# Patient Record
Sex: Female | Born: 1995 | Race: White | Hispanic: No | Marital: Married | Smoking: Current some day smoker
Health system: Southern US, Community
[De-identification: ages and names within clinical notes are randomized; demographics above are authoritative.]

## PROBLEM LIST (undated history)

## (undated) DIAGNOSIS — F32A Depression, unspecified: Secondary | ICD-10-CM

## (undated) DIAGNOSIS — F419 Anxiety disorder, unspecified: Secondary | ICD-10-CM

## (undated) HISTORY — PX: NO PAST SURGERIES: SHX2092

---
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
## 2016-07-17 ENCOUNTER — Other Ambulatory Visit: Payer: Self-pay | Admitting: Family Medicine

## 2016-07-17 ENCOUNTER — Ambulatory Visit
Admission: RE | Admit: 2016-07-17 | Discharge: 2016-07-17 | Disposition: A | Discharge status: Home / Self Care | Payer: 59 | Source: Ambulatory Visit | Attending: Family Medicine | Admitting: Family Medicine

## 2016-07-17 DIAGNOSIS — R109 Unspecified abdominal pain: Secondary | ICD-10-CM

## 2017-08-28 ENCOUNTER — Other Ambulatory Visit: Payer: Self-pay | Admitting: Occupational Medicine

## 2017-08-28 ENCOUNTER — Ambulatory Visit
Admission: RE | Admit: 2017-08-28 | Discharge: 2017-08-28 | Disposition: A | Discharge status: Home / Self Care | Payer: No Typology Code available for payment source | Source: Ambulatory Visit | Attending: Occupational Medicine | Admitting: Occupational Medicine

## 2017-08-28 DIAGNOSIS — Z021 Encounter for pre-employment examination: Secondary | ICD-10-CM

## 2020-02-23 ENCOUNTER — Telehealth: Payer: Self-pay

## 2020-02-23 NOTE — Telephone Encounter (Signed)
Copied from CRM [id number]. Topic: Appointment Scheduling - Scheduling Inquiry for Clinic >> Feb 23, 2020  9:56 AM Crist Infante wrote: Reason for CRM: pt calling to see if she can be worked in sooner for new pt appt.  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  Her previous dr doesn't accept her insurance anymore, and she would have to pay over $200 for an office visit with him. Could Ricki Rodriguez work her in sooner?

## 2020-02-23 NOTE — Telephone Encounter (Signed)
Pt has appt for 03/11/20.  Hoping to get in sooner

## 2020-02-25 NOTE — Telephone Encounter (Signed)
Per Ricki Rodriguez, there are no available openings until her scheduled appt.

## 2020-03-11 ENCOUNTER — Encounter: Payer: Self-pay | Admitting: Physician Assistant

## 2020-03-11 ENCOUNTER — Ambulatory Visit (INDEPENDENT_AMBULATORY_CARE_PROVIDER_SITE_OTHER): Admitting: Physician Assistant

## 2020-03-11 ENCOUNTER — Other Ambulatory Visit: Payer: Self-pay

## 2020-03-11 VITALS — BP 104/69 | HR 70 | Temp 98.2°F | Ht 61.0 in | Wt 137.0 lb

## 2020-03-11 DIAGNOSIS — F329 Major depressive disorder, single episode, unspecified: Secondary | ICD-10-CM

## 2020-03-11 DIAGNOSIS — Z13220 Encounter for screening for lipoid disorders: Secondary | ICD-10-CM

## 2020-03-11 DIAGNOSIS — Z309 Encounter for contraceptive management, unspecified: Secondary | ICD-10-CM | POA: Diagnosis not present

## 2020-03-11 DIAGNOSIS — F32A Depression, unspecified: Secondary | ICD-10-CM

## 2020-03-11 LAB — POCT URINE PREGNANCY: Preg Test, Ur: NEGATIVE

## 2020-03-11 MED ORDER — MEDROXYPROGESTERONE ACETATE 150 MG/ML IM SUSP
150.0000 mg | Freq: Once | INTRAMUSCULAR | Status: AC
  Administered 2020-03-11: 150 mg via INTRAMUSCULAR

## 2020-03-11 MED ORDER — BUSPIRONE HCL 7.5 MG PO TABS: 7.5000 mg | ORAL_TABLET | Freq: Two times a day (BID) | ORAL | 1 refills | Status: DC

## 2020-03-11 NOTE — Progress Notes (Signed)
New patient visit   Patient: Linda Bright   DOB: 1995/09/22   24 y.o. Female  MRN: [id number] Visit Date: 03/11/2020  Today's healthcare provider: Trey Sailors, PA-C   Chief Complaint  Patient presents with  . New Patient (Initial Visit)   Subjective    Linda Bright is a 24 y.o. female who presents today as a new patient to establish care.  HPI  Patient comes in today wanting to start back on Zoloft 25mg  daily. Reports she had sexual side effects however from zoloft and would like to consider switching. She was previously a patient at . She is also wanting to continue Depo shots. Her last one was in 11/2019.   Patient works at the 12/2019. 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 She has recently gotten married to her wife since 11/2019.     History reviewed. No pertinent past medical history. History reviewed. No pertinent surgical history. Family Status  Relation Name Status  . Mother  Alive  . Father  Alive  . Brother  Alive  . PGM  Alive   Family History  Problem Relation Age of Onset  . Graves' disease Mother   . Asthma Brother   . Diabetes Paternal Grandmother    Social History   Socioeconomic History  . Marital status: Married    Spouse name: Not on file  . Number of children: Not on file  . Years of education: Not on file  . Highest education level: Not on file  Occupational History  . Not on file  Tobacco Use  . Smoking status: Never Smoker  . Smokeless tobacco: Never Used  Substance and Sexual Activity  . Alcohol use: Yes    Alcohol/week: 2.0 standard drinks    Types: 2 Cans of beer per week  . Drug use: Never  . Sexual activity: Yes  Other Topics Concern  . Not on file  Social History Narrative  . Not on file   Social Determinants of Health   Financial Resource Strain:   . Difficulty of Paying Living Expenses:   Food Insecurity:   . Worried About 12/2019 in the Last Year:   . Patent examiner in the Last Year:   Transportation Needs:   . Barista (Medical):   Freight forwarder Lack of Transportation (Non-Medical):   Physical Activity:   . Days of Exercise per Week:   . Minutes of Exercise per Session:   Stress:   . Feeling of Stress :   Social Connections:   . Frequency of Communication with Friends and Family:   . Frequency of Social Gatherings with Friends and Family:   . Attends Religious Services:   . Active Member of Clubs or Organizations:   . Attends Marland Kitchen Meetings:   Banker Marital Status:    No outpatient medications prior to visit.   No facility-administered medications prior to visit.   Allergies  Allergen Reactions  . Advil [Ibuprofen]     Rash     There is no immunization history on file for this patient.  Health Maintenance  Topic Date Due  . Hepatitis C Screening  Never done  . HIV Screening  Never done  . TETANUS/TDAP  Never done  . PAP-Cervical Cytology Screening  Never done  . PAP SMEAR-Modifier  Never done  . INFLUENZA VACCINE  03/27/2020    Patient Care Team: 05/27/2020,  PA-C as PCP - General (Physician Assistant)  Review of Systems  Constitutional: Negative.   HENT: Negative.   Eyes: Negative.   Respiratory: Negative.   Cardiovascular: Negative.   Gastrointestinal: Negative.   Endocrine: Negative.   Genitourinary: Negative.   Musculoskeletal: Negative.   Skin: Negative.   Allergic/Immunologic: Negative.   Neurological: Negative.   Hematological: Negative.   Psychiatric/Behavioral: Negative.       Objective    BP 104/69   Pulse 70   Temp 98.2 F (36.8 C)   Ht 5\' 1"  (1.549 m)   Wt 137 lb (62.1 kg)   BMI 25.89 kg/m  Physical Exam Constitutional:      Appearance: Normal appearance.  HENT:     Right Ear: Tympanic membrane normal.     Left Ear: Tympanic membrane normal.  Cardiovascular:     Rate and Rhythm: Normal rate and regular rhythm.     Pulses:  Normal pulses.     Heart sounds: Normal heart sounds.  Pulmonary:     Effort: Pulmonary effort is normal.     Breath sounds: Normal breath sounds.  Skin:    General: Skin is warm and dry.  Neurological:     Mental Status: She is alert and oriented to person, place, and time. Mental status is at baseline.  Psychiatric:        Mood and Affect: Mood normal.        Behavior: Behavior normal.     Depression Screen PHQ 2/9 Scores 03/11/2020  PHQ - 2 Score 2  PHQ- 9 Score 8   Results for orders placed or performed in visit on 03/11/20  CBC with Differential/Platelet  Result Value Ref Range   WBC 5.8 3.4 - 10.8 x10E3/uL   RBC 4.59 3.77 - 5.28 x10E6/uL   Hemoglobin 14.9 11.1 - 15.9 g/dL   Hematocrit 03/13/20 02.5 - 46.6 %   MCV 93 79 - 97 fL   MCH 32.5 26.6 - 33.0 pg   MCHC 35.1 31 - 35 g/dL   RDW 85.2 77.8 - 24.2 %   Platelets 325 150 - 450 x10E3/uL   Neutrophils 45 Not Estab. %   Lymphs 40 Not Estab. %   Monocytes 9 Not Estab. %   Eos 5 Not Estab. %   Basos 1 Not Estab. %   Neutrophils Absolute 2.6 1 - 7 x10E3/uL   Lymphocytes Absolute 2.4 0 - 3 x10E3/uL   Monocytes Absolute 0.5 0 - 0 x10E3/uL   EOS (ABSOLUTE) 0.3 0.0 - 0.4 x10E3/uL   Basophils Absolute 0.1 0 - 0 x10E3/uL   Immature Granulocytes 0 Not Estab. %   Immature Grans (Abs) 0.0 0.0 - 0.1 x10E3/uL  Comprehensive metabolic panel  Result Value Ref Range   Glucose 107 (H) 65 - 99 mg/dL   BUN 11 6 - 20 mg/dL   Creatinine, Ser 35.3 0.57 - 1.00 mg/dL   GFR calc non Af Amer 109 >59 mL/min/1.73   GFR calc Af Amer 125 >59 mL/min/1.73   BUN/Creatinine Ratio 14 9 - 23   Sodium 140 134 - 144 mmol/L   Potassium 4.4 3.5 - 5.2 mmol/L   Chloride 103 96 - 106 mmol/L   CO2 23 20 - 29 mmol/L   Calcium 9.2 8.7 - 10.2 mg/dL   Total Protein 6.9 6.0 - 8.5 g/dL   Albumin 4.5 3.9 - 5.0 g/dL   Globulin, Total 2.4 1.5 - 4.5 g/dL   Albumin/Globulin Ratio 1.9 1.2 - 2.2   Bilirubin  Total 0.4 0.0 - 1.2 mg/dL   Alkaline Phosphatase 77 48 -  121 IU/L   AST 25 0 - 40 IU/L   ALT 23 0 - 32 IU/L  Lipid panel  Result Value Ref Range   Cholesterol, Total 135 100 - 199 mg/dL   Triglycerides 64 0 - 149 mg/dL   HDL 29 (L) >31 mg/dL   VLDL Cholesterol Cal 13 5 - 40 mg/dL   LDL Chol Calc (NIH) 93 0 - 99 mg/dL   Chol/HDL Ratio 4.7 (H) 0.0 - 4.4 ratio  TSH  Result Value Ref Range   TSH 1.210 0.450 - 4.500 uIU/mL  POCT urine pregnancy  Result Value Ref Range   Preg Test, Ur Negative Negative    Assessment & Plan     1. Depression, unspecified depression type  - busPIRone (BUSPAR) 7.5 MG tablet; Take 1 tablet (7.5 mg total) by mouth 2 (two) times daily.  Dispense: 180 tablet; Refill: 1 - CBC with Differential/Platelet - Comprehensive metabolic panel - TSH  2. Encounter for contraceptive management, unspecified type Update Depo today. Patient due on or between Oct 1st- Oct 15th for next injection.  - POCT urine pregnancy - medroxyPROGESTERone (DEPO-PROVERA) injection 150 mg  3. Lipid screening  - Lipid panel   Return in about 6 weeks (around 04/22/2020) for depression.     ITrey Sailors, PA-C, have reviewed all documentation for this visit. The documentation on 03/15/20 for the exam, diagnosis, procedures, and orders are all accurate and complete.    Maryella Shivers  [facility] [phone] (phone) [phone] (fax)  [facility]

## 2020-03-12 LAB — COMPREHENSIVE METABOLIC PANEL
ALT: 23 IU/L (ref 0–32)
AST: 25 IU/L (ref 0–40)
Albumin/Globulin Ratio: 1.9 (ref 1.2–2.2)
Albumin: 4.5 g/dL (ref 3.9–5.0)
Alkaline Phosphatase: 77 IU/L (ref 48–121)
BUN/Creatinine Ratio: 14 (ref 9–23)
BUN: 11 mg/dL (ref 6–20)
Bilirubin Total: 0.4 mg/dL (ref 0.0–1.2)
CO2: 23 mmol/L (ref 20–29)
Calcium: 9.2 mg/dL (ref 8.7–10.2)
Chloride: 103 mmol/L (ref 96–106)
Creatinine, Ser: 0.76 mg/dL (ref 0.57–1.00)
GFR calc Af Amer: 125 mL/min/{1.73_m2} (ref >59)
GFR calc non Af Amer: 109 mL/min/{1.73_m2} (ref >59)
Globulin, Total: 2.4 g/dL (ref 1.5–4.5)
Glucose: 107 mg/dL — ABNORMAL HIGH (ref 65–99)
Potassium: 4.4 mmol/L (ref 3.5–5.2)
Sodium: 140 mmol/L (ref 134–144)
Total Protein: 6.9 g/dL (ref 6.0–8.5)

## 2020-03-12 LAB — CBC WITH DIFFERENTIAL/PLATELET
Basophils Absolute: 0.1 10*3/uL (ref 0.0–0.2)
Basos: 1 %
EOS (ABSOLUTE): 0.3 10*3/uL (ref 0.0–0.4)
Eos: 5 %
Hematocrit: 42.5 % (ref 34.0–46.6)
Hemoglobin: 14.9 g/dL (ref 11.1–15.9)
Immature Grans (Abs): 0 10*3/uL (ref 0.0–0.1)
Immature Granulocytes: 0 %
Lymphocytes Absolute: 2.4 10*3/uL (ref 0.7–3.1)
Lymphs: 40 %
MCH: 32.5 pg (ref 26.6–33.0)
MCHC: 35.1 g/dL (ref 31.5–35.7)
MCV: 93 fL (ref 79–97)
Monocytes Absolute: 0.5 10*3/uL (ref 0.1–0.9)
Monocytes: 9 %
Neutrophils Absolute: 2.6 10*3/uL (ref 1.4–7.0)
Neutrophils: 45 %
Platelets: 325 10*3/uL (ref 150–450)
RBC: 4.59 x10E6/uL (ref 3.77–5.28)
RDW: 12.1 % (ref 11.7–15.4)
WBC: 5.8 10*3/uL (ref 3.4–10.8)

## 2020-03-12 LAB — LIPID PANEL
Chol/HDL Ratio: 4.7 ratio — ABNORMAL HIGH (ref 0.0–4.4)
Cholesterol, Total: 135 mg/dL (ref 100–199)
HDL: 29 mg/dL — ABNORMAL LOW (ref >39)
LDL Chol Calc (NIH): 93 mg/dL (ref 0–99)
Triglycerides: 64 mg/dL (ref 0–149)
VLDL Cholesterol Cal: 13 mg/dL (ref 5–40)

## 2020-03-12 LAB — TSH: TSH: 1.21 u[IU]/mL (ref 0.450–4.500)

## 2020-03-15 ENCOUNTER — Encounter: Payer: Self-pay | Admitting: Physician Assistant

## 2020-03-15 NOTE — Progress Notes (Signed)
A1C was added to labs and faxed to [facility].

## 2020-03-16 ENCOUNTER — Encounter (INDEPENDENT_AMBULATORY_CARE_PROVIDER_SITE_OTHER): Admitting: Physician Assistant

## 2020-03-16 DIAGNOSIS — F419 Anxiety disorder, unspecified: Secondary | ICD-10-CM

## 2020-03-16 DIAGNOSIS — F32A Depression, unspecified: Secondary | ICD-10-CM

## 2020-03-16 DIAGNOSIS — F329 Major depressive disorder, single episode, unspecified: Secondary | ICD-10-CM | POA: Diagnosis not present

## 2020-03-16 LAB — SPECIMEN STATUS REPORT

## 2020-03-16 LAB — HEMOGLOBIN A1C
Est. average glucose Bld gHb Est-mCnc: 97 mg/dL
Hgb A1c MFr Bld: 5 % (ref 4.8–5.6)

## 2020-03-18 MED ORDER — SERTRALINE HCL 25 MG PO TABS: 25.0000 mg | ORAL_TABLET | Freq: Every day | ORAL | 1 refills | Status: DC

## 2020-03-18 NOTE — Telephone Encounter (Signed)
    MyChart Video Visit    Virtual Visit via Video Note   This visit type was conducted due to national recommendations for restrictions regarding the COVID-19 Pandemic (e.g. social distancing) in an effort to limit this patient's exposure and mitigate transmission in our community. This patient is at least at moderate risk for complications without adequate follow up. This format is felt to be most appropriate for this patient at this time. Physical exam was limited by quality of the video and audio technology used for the visit.   Patient location: Home Provider location: Office   I discussed the limitations of evaluation and management by telemedicine and the availability of in person appointments. The patient expressed understanding and agreed to proceed.   Patient: Linda Bright   DOB: November 01, 1995   24 y.o. Female  MRN: [id number] Visit Date: 03/18/2020 Today's healthcare provider: Trey Sailors, PA-C   CC: Depression    HPI  Patient presenting today for follow up of depression. Was on zoloft 25 mg daily previously but after insurance changes, she ran out of the medication and thus weaned off. She had low sex drive from this and so wanted to switch medications. She was placed on Buspar 7.5 mg BID but this was causing intolerable drowsiness. Looking to switch.    Medications: Outpatient Medications Prior to Visit  Medication Sig  . busPIRone (BUSPAR) 7.5 MG tablet Take 1 tablet (7.5 mg total) by mouth 2 (two) times daily.   No facility-administered medications prior to visit.    ROS   Negative except for HPI         Physical Exam  Resp: No respiratory distress. Breathing unlabored. Behavioral: Mood and behavior normal.       1. Anxiety and depression  Can decrease buspar or switch back to zoloft. Patient would like to switch back to zoloft.  - sertraline (ZOLOFT) 25 MG tablet; Take 1 tablet (25 mg total) by mouth daily.  Dispense: 90 tablet; Refill:  1    No follow-ups on file.  @ASSESSMENTEND @  I discussed the assessment and treatment plan with the patient. The patient was provided an opportunity to ask questions and all were answered. The patient agreed with the plan and demonstrated an understanding of the instructions.   The patient was advised to call back or seek an in-person evaluation if the symptoms worsen or if the condition fails to improve as anticipated.  I , PA-C, have reviewed all documentation for this visit. The documentation on 03/18/20 for the exam, diagnosis, procedures, and orders are all accurate and complete.   03/20/20, PA-C [facility]

## 2020-06-01 ENCOUNTER — Telehealth: Payer: Self-pay | Admitting: *Deleted

## 2020-06-01 ENCOUNTER — Encounter: Payer: Self-pay | Admitting: Physician Assistant

## 2020-06-01 ENCOUNTER — Ambulatory Visit (INDEPENDENT_AMBULATORY_CARE_PROVIDER_SITE_OTHER): Admitting: Physician Assistant

## 2020-06-01 ENCOUNTER — Other Ambulatory Visit: Payer: Self-pay

## 2020-06-01 VITALS — BP 95/68 | HR 66 | Temp 98.6°F | Resp 16 | Ht 62.0 in | Wt 134.0 lb

## 2020-06-01 DIAGNOSIS — F32A Depression, unspecified: Secondary | ICD-10-CM

## 2020-06-01 DIAGNOSIS — Z309 Encounter for contraceptive management, unspecified: Secondary | ICD-10-CM

## 2020-06-01 MED ORDER — BUPROPION HCL ER (SR) 150 MG PO TB12: 150.0000 mg | ORAL_TABLET | Freq: Two times a day (BID) | ORAL | 1 refills | Status: DC

## 2020-06-01 MED ORDER — MEDROXYPROGESTERONE ACETATE 150 MG/ML IM SUSP
150.0000 mg | Freq: Once | INTRAMUSCULAR | Status: AC
  Administered 2020-06-01: 150 mg via INTRAMUSCULAR

## 2020-06-01 NOTE — Chronic Care Management (AMB) (Signed)
  Care Management   Note  06/01/2020 Name: Linda Bright MRN: [id number] DOB: 01-11-1996  Linda Bright is a 24 y.o. year old female who is a primary care patient of Trey Sailors, [place]. I reached out to [name] by phone today in response to a referral sent by [name] health plan.    [name] was given information about care management services today including:  1. Care management services include personalized support from designated clinical staff supervised by her physician, including individualized plan of care and coordination with other care providers 2. 24/7 contact phone numbers for assistance for urgent and routine care needs. 3. The patient may stop care management services at any time by phone call to the office staff.  Patient agreed to services and verbal consent obtained.   Follow up plan: Telephone appointment with care management team member scheduled for: 06/07/2020 [facility] Guide, Embedded Care Coordination [facility] Management

## 2020-06-01 NOTE — Progress Notes (Signed)
Established patient visit   Patient: Linda Bright   DOB: 1995-11-17   24 y.o. Female  MRN: [id number] Visit Date: 06/01/2020  Today's healthcare provider: Trey Sailors, PA-C   Chief Complaint  Patient presents with  . Depression  . Contraception   Subjective    HPI  Depression, Follow-up  She  was last seen for this 3 months ago. Changes made at last visit include started on Zoloft 25mg . Patient was previously on zoloft which had worked well for her but causes sexual side effects. Was then switched to buspar which she reported caused drowsiness and so she was switched back to zoloft at her request.    She reports good compliance with treatment. She is having side effects.   She reports good tolerance of treatment. Current symptoms include: difficulty concentrating, fatigue, insomnia and weight loss. Reports sexual side effects. Hasn't been fatigued, hasn't been working out as much. Feels immediately sick after eating. Sex drive decreased. Reports work is about as stressful as usual.   She feels she is Worse since last visit.  Depression screen PHQ 2/9 03/11/2020  Decreased Interest 1  Down, Depressed, Hopeless 1  PHQ - 2 Score 2  Altered sleeping 2  Tired, decreased energy 2  Change in appetite 1  Feeling bad or failure about yourself  0  Trouble concentrating 1  Moving slowly or fidgety/restless 0  Suicidal thoughts 0  PHQ-9 Score 8  Difficult doing work/chores Somewhat difficult    Wt Readings from Last 3 Encounters:  06/01/20 134 lb (60.8 kg)  03/11/20 137 lb (62.1 kg)        Medications: Outpatient Medications Prior to Visit  Medication Sig  . sertraline (ZOLOFT) 25 MG tablet Take 1 tablet (25 mg total) by mouth daily.   No facility-administered medications prior to visit.    Review of Systems  Constitutional: Positive for activity change, appetite change and fatigue.  Gastrointestinal: Positive for nausea. Negative for abdominal pain,  constipation, diarrhea and vomiting.      Objective    BP 95/68   Pulse 66   Temp 98.6 F (37 C)   Resp 16   Ht 5\' 2"  (1.575 m)   Wt 134 lb (60.8 kg)   BMI 24.51 kg/m    Physical Exam Constitutional:      Appearance: Normal appearance.  Cardiovascular:     Rate and Rhythm: Normal rate and regular rhythm.  Pulmonary:     Effort: Pulmonary effort is normal. No respiratory distress.  Skin:    General: Skin is warm and dry.  Neurological:     Mental Status: She is alert and oriented to person, place, and time. Mental status is at baseline.  Psychiatric:        Mood and Affect: Affect normal. Mood is depressed.        Behavior: Behavior normal.       No results found for any visits on 06/01/20.  Assessment & Plan    1. Depression, unspecified depression type  Change to wellbutrin due to sexual side effects. Discontinue zoloft. Refer to counseling. Will have her follow up in 3 months for CPE w/ PAP and depression follow up.  - buPROPion (WELLBUTRIN SR) 150 MG 12 hr tablet; Take 1 tablet (150 mg total) by mouth 2 (two) times daily.  Dispense: 180 tablet; Refill: 1 - Ambulatory referral to Chronic Care Management Services  2. Encounter for contraceptive management, unspecified type  - medroxyPROGESTERone (DEPO-PROVERA) injection 150  mg   Return in about 3 months (around 09/01/2020) for CPE and f/u .      ITrey Sailors, PA-C, have reviewed all documentation for this visit. The documentation on 06/01/20 for the exam, diagnosis, procedures, and orders are all accurate and complete.  The entirety of the information documented in the History of Present Illness, Review of Systems and Physical Exam were personally obtained by me. Portions of this information were initially documented by Anson [place], CMA and reviewed by me for thoroughness and accuracy.     Maryella Shivers  [facility] [phone] (phone) [phone] (fax)  [facility]

## 2020-06-07 ENCOUNTER — Ambulatory Visit: Admitting: *Deleted

## 2020-06-07 DIAGNOSIS — F419 Anxiety disorder, unspecified: Secondary | ICD-10-CM

## 2020-06-07 DIAGNOSIS — F32A Depression, unspecified: Secondary | ICD-10-CM

## 2020-06-08 NOTE — Chronic Care Management (AMB) (Signed)
   Care Management    Clinical Social Work Follow Up Note  06/08/2020 Name: Linda Bright MRN: [id number] DOB: Nov 26, 1995  Linda Bright is a 24 y.o. year old female who is a primary care patient of Trey Sailors, [place]. The CCM team was consulted for assistance with Mental Health Counseling and Resources.   Review of patient status, including review of consultants reports, other relevant assessments, and collaboration with appropriate care team members and the patient's provider was performed as part of comprehensive patient evaluation and provision of chronic care management services.    SDOH (Social Determinants of Health) assessments performed: No    Outpatient Encounter Medications as of 06/07/2020  Medication Sig  . buPROPion (WELLBUTRIN SR) 150 MG 12 hr tablet Take 1 tablet (150 mg total) by mouth 2 (two) times daily.  . sertraline (ZOLOFT) 25 MG tablet Take 1 tablet (25 mg total) by mouth daily.   No facility-administered encounter medications on file as of 06/07/2020.     Goals Addressed              This Visit's Progress   .  "I would like to start therapy again" (pt-stated)        CARE PLAN ENTRY (see longitudinal plan of care for additional care plan information)  Current Barriers:  Marland Kitchen Mental Health Concerns   Clinical Social Work Clinical Goal(s):  Marland Kitchen Over the next 90 days, patient will follow up with a local mental health program* as directed by SW  Interventions: . Inter-disciplinary care team collaboration (see longitudinal plan of care) . Patient interviewed and appropriate assessments performed . Patient discussed symptoms of depression and PTSD . Patient verbalized positive coping strategies used to address her depression and anxiety, including the start of a new medication prescribed that has been helpful . Emotional support and positive reinforcement provided . Patient discussed desire to begin therapy again, with prior history of mental  health follow up at the [facility]. . Patient discussed that she received EMDR treatment and found it beneficial . Provided patient with information about with information regarding the [facility] confirming that they are in network and they have a therapist who specializes in EMDR . Collaborated with the [facility]-referral completed for long term mental health follow up . Discussed plans with patient for ongoing care management follow up and provided patient with direct contact information for care management team  Patient Self Care Activities:  . Patient verbalizes understanding of plan to follow up with a mental health provider for ongoing/long term mental health counseling . Attends all scheduled provider appointments . Performs ADL's independently . Performs IADL's independently . Unaware of local providers that provide EMDR treatment  Initial goal documentation         Follow Up Plan: SW will follow up with patient by phone over the next 7-14 busuines days    Abra Lingenfelter, [place] Clinical Social Worker  [facility] [phone]

## 2020-06-08 NOTE — Patient Instructions (Signed)
Thank you allowing the Chronic Care Management Team to be a part of your care! It was a pleasure speaking with you today!  1. Please anticipate a phone call from the mental health program to schedule your initial intake appointment.  CCM (Chronic Care Management) Team   Juanell Fairly RN, BSN Nurse Care Coordinator  [phone]  [place], LCSW Clinical Social Worker [phone]  Goals Addressed              This Visit's Progress   .  "I would like to start therapy again" (pt-stated)        CARE PLAN ENTRY (see longitudinal plan of care for additional care plan information)  Current Barriers:  Marland Kitchen Mental Health Concerns   Clinical Social Work Clinical Goal(s):  Marland Kitchen Over the next 90 days, patient will follow up with a local mental health program* as directed by SW  Interventions: . Inter-disciplinary care team collaboration (see longitudinal plan of care) . Patient interviewed and appropriate assessments performed . Patient discussed symptoms of depression and PTSD . Patient verbalized positive coping strategies used to address her depression and anxiety, including the start of a new medication prescribed that has been helpful . Emotional support and positive reinforcement provided . Patient discussed desire to begin therapy again, with prior history of mental health follow up at the [facility]. . Patient discussed that she received EMDR treatment and found it beneficial . Provided patient with information about with information regarding the [facility] confirming that they are in network and they have a therapist who specializes in EMDR . Collaborated with the [facility]-referral completed for long term mental health follow up . Discussed plans with patient for ongoing care management follow up and provided patient with direct contact information for care management team  Patient Self Care Activities:  . Patient verbalizes  understanding of plan to follow up with a mental health provider for ongoing/long term mental health counseling . Attends all scheduled provider appointments . Performs ADL's independently . Performs IADL's independently . Unaware of local providers that provide EMDR treatment  Initial goal documentation         The patient verbalized understanding of instructions provided today and declined a print copy of patient instruction materials.   Telephone follow up appointment with care management team member scheduled for:  06/16/20

## 2020-06-16 ENCOUNTER — Ambulatory Visit: Payer: Self-pay | Admitting: *Deleted

## 2020-06-16 DIAGNOSIS — F419 Anxiety disorder, unspecified: Secondary | ICD-10-CM

## 2020-06-16 DIAGNOSIS — F32A Depression, unspecified: Secondary | ICD-10-CM

## 2020-06-16 NOTE — Chronic Care Management (AMB) (Signed)
   Care Management    Clinical Social Work Follow Up Note  06/16/2020 Name: Linda Bright MRN: [id number] DOB: 22-Jun-1996  Linda Bright is a 24 y.o. year old female who is a primary care patient of Trey Sailors, [place]. The CCM team was consulted for assistance with Mental Health Counseling and Resources.   Review of patient status, including review of consultants reports, other relevant assessments, and collaboration with appropriate care team members and the patient's provider was performed as part of comprehensive patient evaluation and provision of chronic care management services.    SDOH (Social Determinants of Health) assessments performed: No    Outpatient Encounter Medications as of 06/16/2020  Medication Sig  . buPROPion (WELLBUTRIN SR) 150 MG 12 hr tablet Take 1 tablet (150 mg total) by mouth 2 (two) times daily.  . sertraline (ZOLOFT) 25 MG tablet Take 1 tablet (25 mg total) by mouth daily.   No facility-administered encounter medications on file as of 06/16/2020.     Goals Addressed              This Visit's Progress   .  "I would like to start therapy again" (pt-stated)        CARE PLAN ENTRY (see longitudinal plan of care for additional care plan information)  Current Barriers:  Marland Kitchen Mental Health Concerns   Clinical Social Work Clinical Goal(s):  Marland Kitchen Over the next 90 days, patient will follow up with a local mental health program* as directed by SW  Interventions: . Follow up phone call to patient regarding mental health follow up . Patient confirmed initial appointment taking place today and reports that it seemed to go well . Patient verbalized plan to continue with [facility] for ongoing mental health treatment . Patient provided with positive reinforcement for follow through with mental health treatment . Patient encouraged to call this social worker with any additional questions or concerns regarding her  mental health or community resource needs. . Discussed plans with patient for ongoing care management follow up and provided patient with direct contact information for care management team  Patient Self Care Activities:  . Patient verbalizes understanding of plan to follow up with a mental health provider for ongoing/long term mental health counseling . Attends all scheduled provider appointments . Performs ADL's independently . Performs IADL's independently . Unaware of local providers that provide EMDR treatment  Initial goal documentation         Follow Up Plan: SW will follow up with patient by phone over the next 7-14 business days regarding the status of her mental health treatment    Verna Czech, LCSW Clinical Social Worker  [facility] [phone]

## 2020-06-16 NOTE — Patient Instructions (Signed)
Thank you allowing the Chronic Care Management Team to be a part of your care! It was a pleasure speaking with you today!  1. Please continue to follow up with your outpatient mental health counselor  CCM (Chronic Care Management) Team   Juanell Fairly RN, BSN Nurse Care Coordinator  [phone]  Wayland Baik [address], LCSW Clinical Social Worker [phone]  Goals Addressed              This Visit's Progress   .  "I would like to start therapy again" (pt-stated)        CARE PLAN ENTRY (see longitudinal plan of care for additional care plan information)  Current Barriers:  Marland Kitchen Mental Health Concerns   Clinical Social Work Clinical Goal(s):  Marland Kitchen Over the next 90 days, patient will follow up with a local mental health program* as directed by SW  Interventions: . Follow up phone call to patient regarding mental health follow up . Patient confirmed initial appointment taking place today and reports that it seemed to go well . Patient verbalized plan to continue with the [facility] for ongoing mental health treatment . Patient provided with positive reinforcement for follow through with mental health treatment . Patient encouraged to call this social worker with any additional questions or concerns regarding her mental health or community resource needs. . Discussed plans with patient for ongoing care management follow up and provided patient with direct contact information for care management team  Patient Self Care Activities:  . Patient verbalizes understanding of plan to follow up with a mental health provider for ongoing/long term mental health counseling . Attends all scheduled provider appointments . Performs ADL's independently . Performs IADL's independently . Unaware of local providers that provide EMDR treatment  Initial goal documentation         The patient verbalized understanding of instructions provided today and declined a print  copy of patient instruction materials.   Telephone follow up appointment with care management team member scheduled for:  06/30/20

## 2020-06-30 ENCOUNTER — Ambulatory Visit: Payer: Self-pay | Admitting: *Deleted

## 2020-06-30 DIAGNOSIS — F419 Anxiety disorder, unspecified: Secondary | ICD-10-CM

## 2020-06-30 DIAGNOSIS — F32A Depression, unspecified: Secondary | ICD-10-CM

## 2020-06-30 NOTE — Patient Instructions (Signed)
Thank you allowing the Chronic Care Management Team to be a part of your care! It was a pleasure speaking with you today!  1. Please call this social worker with any questions or concerns regarding your mental health needs.  CCM (Chronic Care Management) Team   Juanell Fairly RN, BSN Nurse Care Coordinator  [phone]   [place], LCSW Clinical Social Worker [phone]  Goals Addressed              This Visit's Progress   .  "I would like to start therapy again" (pt-stated)        CARE PLAN ENTRY (see longitudinal plan of care for additional care plan information)  Current Barriers:  Marland Kitchen Mental Health Concerns   Clinical Social Work Clinical Goal(s):  Marland Kitchen Over the next 90 days, patient will follow up with a local mental health program* as directed by SW  Interventions: . Follow up phone call to patient regarding mental health follow up . Patient confirmed active participation in mental health treatment and feels that it is going well . Patient verbalized plan to continue with the [facility] for ongoing mental health treatment . Patient provided with positive reinforcement for follow through with mental health treatment . Patient encouraged to call this social worker with any additional questions or concerns regarding her mental health or community resource needs. . Discussed plans with patient for ongoing care management follow up and provided patient with direct contact information for care management team  Patient Self Care Activities:  . Patient verbalizes understanding of plan to follow up with a mental health provider for ongoing/long term mental health counseling . Attends all scheduled provider appointments . Performs ADL's independently . Performs IADL's independently . Unaware of local providers that provide EMDR treatment  Initial goal documentation         The patient verbalized understanding of instructions  provided today and declined a print copy of patient instruction materials.   No further follow up required: Patient to call this social worker with any additonal mental health needs

## 2020-06-30 NOTE — Chronic Care Management (AMB) (Signed)
  Chronic Care Management    Clinical Social Work Follow Up Note  06/30/2020 Name: Linda Bright MRN: [id number] DOB: 1996-06-18  Linda Bright is a 24 y.o. year old female who is a primary care patient of Trey Sailors, [place]. The CCM team was consulted for assistance with Mental Health Counseling and Resources.   Review of patient status, including review of consultants reports, other relevant assessments, and collaboration with appropriate care team members and the patient's provider was performed as part of comprehensive patient evaluation and provision of chronic care management services.    SDOH (Social Determinants of Health) assessments performed: No    Outpatient Encounter Medications as of 06/30/2020  Medication Sig  . buPROPion (WELLBUTRIN SR) 150 MG 12 hr tablet Take 1 tablet (150 mg total) by mouth 2 (two) times daily.  . sertraline (ZOLOFT) 25 MG tablet Take 1 tablet (25 mg total) by mouth daily.   No facility-administered encounter medications on file as of 06/30/2020.     Goals Addressed              This Visit's Progress   .  "I would like to start therapy again" (pt-stated)        CARE PLAN ENTRY (see longitudinal plan of care for additional care plan information)  Current Barriers:  Marland Kitchen Mental Health Concerns   Clinical Social Work Clinical Goal(s):  Marland Kitchen Over the next 90 days, patient will follow up with a local mental health program* as directed by SW  Interventions: . Follow up phone call to patient regarding mental health follow up . Patient confirmed active participation in mental health treatment and feels that it is going well . Patient verbalized plan to continue with the [facility] for ongoing mental health treatment . Patient provided with positive reinforcement for follow through with mental health treatment . Patient encouraged to call this social worker with any additional questions or concerns regarding  her mental health or community resource needs. . Discussed plans with patient for ongoing care management follow up and provided patient with direct contact information for care management team  Patient Self Care Activities:  . Patient verbalizes understanding of plan to follow up with a mental health provider for ongoing/long term mental health counseling . Attends all scheduled provider appointments . Performs ADL's independently . Performs IADL's independently . Unaware of local providers that provide EMDR treatment  Initial goal documentation         Follow Up Plan: Client will contact this social worker with any additonal community resource needs    Verna Czech, LCSW Clinical Social Worker  [facility] Care Management [phone]

## 2020-07-31 ENCOUNTER — Encounter: Payer: Self-pay | Admitting: Physician Assistant

## 2020-07-31 DIAGNOSIS — L700 Acne vulgaris: Secondary | ICD-10-CM

## 2020-08-23 NOTE — Progress Notes (Signed)
Complete physical exam   Patient: Linda CoralCaroline Lober   DOB: October 26, 1995   24 y.o. Female  MRN: [id number] Visit Date: 08/24/2020  Today's healthcare provider: Trey SailorsAdriana M Aarion Metzgar, PA-C   Chief Complaint  Patient presents with  . Annual Exam   Subjective    Linda Bright is a 24 y.o. female who presents today for a complete physical exam. She is also due for her depo injection today.   She reports consuming a general diet. Exercises Regularly She generally feels well. She reports sleeping well. She does not have additional problems to discuss today.   Depression, Follow-up  She  was last seen for this 2 months ago. Changes made at last visit include discontinuing Zoloft and starting Wellbutrin 150mg  BID.   She reports excellent compliance with treatment. She is not having side effects.   She reports excellent tolerance of treatment. She feels she is Improved since last visit.  [facility] 06/07/2020 03/11/2020  Decreased Interest 1 1  Down, Depressed, Hopeless 1 1  PHQ - 2 Score 2 2  Altered sleeping 2 2  Tired, decreased energy 1 2  Change in appetite - 1  Feeling bad or failure about yourself  0 0  Trouble concentrating 1 1  Moving slowly or fidgety/restless 0 0  Suicidal thoughts 0 0  PHQ-9 Score 6 8  Difficult doing work/chores - Somewhat difficult   No past medical history on file. Past Surgical History:  Procedure Laterality Date  . NO PAST SURGERIES     Social History   Socioeconomic History  . Marital status: Married    Spouse name: Not on file  . Number of children: Not on file  . Years of education: Not on file  . Highest education level: Not on file  Occupational History  . Not on file  Tobacco Use  . Smoking status: Never Smoker  . Smokeless tobacco: Never Used  Vaping Use  . Vaping Use: Never used  Substance and Sexual Activity  . Alcohol use: Yes    Alcohol/week: 2.0 standard drinks    Types: 2 Cans of beer per week   . Drug use: Never  . Sexual activity: Yes  Other Topics Concern  . Not on file  Social History Narrative  . Not on file   Social Determinants of Health   Financial Resource Strain: Low Risk   . Difficulty of Paying Living Expenses: Not hard at all  Food Insecurity: No Food Insecurity  . Worried About Programme researcher, broadcasting/film/videounning Out of Food in the Last Year: Never true  . Ran Out of Food in the Last Year: Never true  Transportation Needs: Not on file  Physical Activity: Sufficiently Active  . Days of Exercise per Week: 5 days  . Minutes of Exercise per Session: 50 min  Stress: Stress Concern Present  . Feeling of Stress : Rather much  Social Connections: Moderately Isolated  . Frequency of Communication with Friends and Family: Three times a week  . Frequency of Social Gatherings with Friends and Family: Three times a week  . Attends Religious Services: Never  . Active Member of Clubs or Organizations: No  . Attends BankerClub or Organization Meetings: Never  . Marital Status: Married  Catering managerntimate Partner Violence: Not At Risk  . Fear of Current or Ex-Partner: No  . Emotionally Abused: No  . Physically Abused: No  . Sexually Abused: No   Family Status  Relation Name Status  . Mother  Alive  .  Father  Alive  . Brother  Alive  . PGM  Alive  . Emelda Brothers  Deceased  . Neg Hx  (Not Specified)   Family History  Problem Relation Age of Onset  . Graves' disease Mother   . CVA Father   . Asthma Brother   . Sleep apnea Brother   . Diabetes Paternal Grandmother   . Breast cancer Paternal Aunt   . Colon cancer Neg Hx    Allergies  Allergen Reactions  . Advil [Ibuprofen]     Rash    Patient Care Team: Maryella Shivers as PCP - General (Physician Assistant) Wenda Overland, LCSW as Social Worker   Medications: Outpatient Medications Prior to Visit  Medication Sig  . buPROPion (WELLBUTRIN SR) 150 MG 12 hr tablet Take 1 tablet (150 mg total) by mouth 2 (two) times daily.  . sertraline  (ZOLOFT) 25 MG tablet Take 1 tablet (25 mg total) by mouth daily.   No facility-administered medications prior to visit.    Review of Systems  Constitutional: Negative.   HENT: Negative.   Eyes: Negative.   Respiratory: Negative.   Cardiovascular: Negative.   Gastrointestinal: Negative.   Endocrine: Negative.   Genitourinary: Negative.   Musculoskeletal: Negative.   Skin: Negative.   Allergic/Immunologic: Negative.   Neurological: Negative.   Hematological: Negative.   Psychiatric/Behavioral: Negative.       Objective    BP 97/72 (BP Location: Right Arm, Patient Position: Sitting, Cuff Size: Large)   Pulse 66   Temp 98.8 F (37.1 C) (Oral)   Ht 5\' 1"  (1.549 m)   Wt 132 lb (59.9 kg)   SpO2 100%   BMI 24.94 kg/m    Physical Exam Exam conducted with a chaperone present.  Constitutional:      Appearance: Normal appearance.  HENT:     Right Ear: Tympanic membrane and ear canal normal.     Left Ear: Tympanic membrane and ear canal normal.  Cardiovascular:     Rate and Rhythm: Normal rate and regular rhythm.     Heart sounds: Normal heart sounds.  Pulmonary:     Effort: Pulmonary effort is normal.     Breath sounds: Normal breath sounds.  Abdominal:     General: Bowel sounds are normal.     Palpations: Abdomen is soft.  Genitourinary:    General: Normal vulva.     Vagina: Normal.     Cervix: Normal.  Musculoskeletal:     Cervical back: Normal range of motion and neck supple.  Lymphadenopathy:     Cervical: No cervical adenopathy.  Skin:    General: Skin is warm and dry.  Neurological:     Mental Status: She is alert and oriented to person, place, and time. Mental status is at baseline.  Psychiatric:        Mood and Affect: Mood normal.        Behavior: Behavior normal.       Last depression screening scores PHQ 2/9 Scores 06/07/2020 03/11/2020  PHQ - 2 Score 2 2  PHQ- 9 Score 6 8   Last fall risk screening Fall Risk  03/11/2020  Falls in the past  year? 0  Number falls in past yr: 0  Injury with Fall? 0  Risk for fall due to : No Fall Risks  Follow up Falls evaluation completed   Last Audit-C alcohol use screening Alcohol Use Disorder Test (AUDIT) 06/07/2020  1. How often do you have a drink  containing alcohol? 1  2. How many drinks containing alcohol do you have on a typical day when you are drinking? 0  3. How often do you have six or more drinks on one occasion? 0  AUDIT-C Score 1  4. How often during the last year have you found that you were not able to stop drinking once you had started? -  5. How often during the last year have you failed to do what was normally expected from you because of drinking? -  6. How often during the last year have you needed a first drink in the morning to get yourself going after a heavy drinking session? -  7. How often during the last year have you had a feeling of guilt of remorse after drinking? -  8. How often during the last year have you been unable to remember what happened the night before because you had been drinking? -  9. Have you or someone else been injured as a result of your drinking? -  10. Has a relative or friend or a doctor or another health worker been concerned about your drinking or suggested you cut down? -  Alcohol Use Disorder Identification Test Final Score (AUDIT) -  Alcohol Brief Interventions/Follow-up -   A score of 3 or more in women, and 4 or more in men indicates increased risk for alcohol abuse, EXCEPT if all of the points are from question 1   No results found for any visits on 08/24/20.  Assessment & Plan    Routine Health Maintenance and Physical Exam  Exercise Activities and Dietary recommendations Goals    .  "I would like to start therapy again" (pt-stated)      CARE PLAN ENTRY (see longitudinal plan of care for additional care plan information)  Current Barriers:  Marland Kitchen Mental Health Concerns   Clinical Social Work Clinical Goal(s):  Marland Kitchen Over the  next 90 days, patient will follow up with a local mental health program* as directed by SW  Interventions: . Follow up phone call to patient regarding mental health follow up . Patient confirmed active participation in mental health treatment and feels that it is going well . Patient verbalized plan to continue with the [facility] for ongoing mental health treatment . Patient provided with positive reinforcement for follow through with mental health treatment . Patient encouraged to call this social worker with any additional questions or concerns regarding her mental health or community resource needs. . Discussed plans with patient for ongoing care management follow up and provided patient with direct contact information for care management team  Patient Self Care Activities:  . Patient verbalizes understanding of plan to follow up with a mental health provider for ongoing/long term mental health counseling . Attends all scheduled provider appointments . Performs ADL's independently . Performs IADL's independently . Unaware of local providers that provide EMDR treatment  Initial goal documentation         There is no immunization history on file for this patient.  Health Maintenance  Topic Date Due  . Hepatitis C Screening  Never done  . HIV Screening  Never done  . TETANUS/TDAP  Never done  . PAP-Cervical Cytology Screening  Never done  . PAP SMEAR-Modifier  Never done  . INFLUENZA VACCINE  Never done    Discussed health benefits of physical activity, and encouraged her to engage in regular exercise appropriate for her age and condition.  1. Annual physical exam  2. Encounter for contraceptive management, unspecified type  - medroxyPROGESTERone (DEPO-PROVERA) injection 150 mg  3. Cervical cancer screening  - Cytology - PAP  4. Acne, unspecified acne type  - Ambulatory referral to Dermatology   No follow-ups on file.     ITrey Sailors, PA-C, have reviewed all documentation for this visit. The documentation on 08/24/20 for the exam, diagnosis, procedures, and orders are all accurate and complete.  The entirety of the information documented in the History of Present Illness, Review of Systems and Physical Exam were personally obtained by me. Portions of this information were initially documented by Kavin Leech, CMA and reviewed by me for thoroughness and accuracy.     Maryella Shivers  [facility] [phone] (phone) [phone] (fax)  [facility]

## 2020-08-24 ENCOUNTER — Encounter: Payer: Self-pay | Admitting: Physician Assistant

## 2020-08-24 ENCOUNTER — Other Ambulatory Visit: Payer: Self-pay

## 2020-08-24 ENCOUNTER — Ambulatory Visit (INDEPENDENT_AMBULATORY_CARE_PROVIDER_SITE_OTHER): Admitting: Physician Assistant

## 2020-08-24 ENCOUNTER — Other Ambulatory Visit (HOSPITAL_COMMUNITY)
Admission: RE | Admit: 2020-08-24 | Discharge: 2020-08-24 | Disposition: A | Discharge status: Home / Self Care | Payer: Self-pay | Source: Ambulatory Visit | Attending: Physician Assistant | Admitting: Physician Assistant

## 2020-08-24 VITALS — BP 97/72 | HR 66 | Temp 98.8°F | Ht 61.0 in | Wt 132.0 lb

## 2020-08-24 DIAGNOSIS — Z124 Encounter for screening for malignant neoplasm of cervix: Secondary | ICD-10-CM | POA: Diagnosis not present

## 2020-08-24 DIAGNOSIS — L709 Acne, unspecified: Secondary | ICD-10-CM

## 2020-08-24 DIAGNOSIS — Z0000 Encounter for general adult medical examination without abnormal findings: Secondary | ICD-10-CM

## 2020-08-24 DIAGNOSIS — Z309 Encounter for contraceptive management, unspecified: Secondary | ICD-10-CM

## 2020-08-24 MED ORDER — MEDROXYPROGESTERONE ACETATE 150 MG/ML IM SUSP
150.0000 mg | Freq: Once | INTRAMUSCULAR | Status: AC
  Administered 2020-08-24: 150 mg via INTRAMUSCULAR

## 2020-08-24 NOTE — Patient Instructions (Signed)
Preventive Care 21-26 Years Old, Female Preventive care refers to visits with your health care provider and lifestyle choices that can promote health and wellness. This includes:  A yearly physical exam. This may also be called an annual well check.  Regular dental visits and eye exams.  Immunizations.  Screening for certain conditions.  Healthy lifestyle choices, such as eating a healthy diet, getting regular exercise, not using drugs or products that contain nicotine and tobacco, and limiting alcohol use. What can I expect for my preventive care visit? Physical exam Your health care provider will check your:  Height and weight. This may be used to calculate body mass index (BMI), which tells if you are at a healthy weight.  Heart rate and blood pressure.  Skin for abnormal spots. Counseling Your health care provider may ask you questions about your:  Alcohol, tobacco, and drug use.  Emotional well-being.  Home and relationship well-being.  Sexual activity.  Eating habits.  Work and work environment.  Method of birth control.  Menstrual cycle.  Pregnancy history. What immunizations do I need?  Influenza (flu) vaccine  This is recommended every year. Tetanus, diphtheria, and pertussis (Tdap) vaccine  You may need a Td booster every 10 years. Varicella (chickenpox) vaccine  You may need this if you have not been vaccinated. Human papillomavirus (HPV) vaccine  If recommended by your health care provider, you may need three doses over 6 months. Measles, mumps, and rubella (MMR) vaccine  You may need at least one dose of MMR. You may also need a second dose. Meningococcal conjugate (MenACWY) vaccine  One dose is recommended if you are age 19-21 years and a first-year college student living in a residence hall, or if you have one of several medical conditions. You may also need additional booster doses. Pneumococcal conjugate (PCV13) vaccine  You may need  this if you have certain conditions and were not previously vaccinated. Pneumococcal polysaccharide (PPSV23) vaccine  You may need one or two doses if you smoke cigarettes or if you have certain conditions. Hepatitis A vaccine  You may need this if you have certain conditions or if you travel or work in places where you may be exposed to hepatitis A. Hepatitis B vaccine  You may need this if you have certain conditions or if you travel or work in places where you may be exposed to hepatitis B. Haemophilus influenzae type b (Hib) vaccine  You may need this if you have certain conditions. You may receive vaccines as individual doses or as more than one vaccine together in one shot (combination vaccines). Talk with your health care provider about the risks and benefits of combination vaccines. What tests do I need?  Blood tests  Lipid and cholesterol levels. These may be checked every 5 years starting at age 20.  Hepatitis C test.  Hepatitis B test. Screening  Diabetes screening. This is done by checking your blood sugar (glucose) after you have not eaten for a while (fasting).  Sexually transmitted disease (STD) testing.  BRCA-related cancer screening. This may be done if you have a family history of breast, ovarian, tubal, or peritoneal cancers.  Pelvic exam and Pap test. This may be done every 3 years starting at age 21. Starting at age 30, this may be done every 5 years if you have a Pap test in combination with an HPV test. Talk with your health care provider about your test results, treatment options, and if necessary, the need for more tests.   Follow these instructions at home: Eating and drinking   Eat a diet that includes fresh fruits and vegetables, whole grains, lean protein, and low-fat dairy.  Take vitamin and mineral supplements as recommended by your health care provider.  Do not drink alcohol if: ? Your health care provider tells you not to drink. ? You are  pregnant, may be pregnant, or are planning to become pregnant.  If you drink alcohol: ? Limit how much you have to 0-1 drink a day. ? Be aware of how much alcohol is in your drink. In the [place] one drink equals one 12 oz bottle of beer (355 mL), one 5 oz glass of wine (148 mL), or one 1 oz glass of hard liquor (44 mL). Lifestyle  Take daily care of your teeth and gums.  Stay active. Exercise for at least 30 minutes on 5 or more days each week.  Do not use any products that contain nicotine or tobacco, such as cigarettes, e-cigarettes, and chewing tobacco. If you need help quitting, ask your health care provider.  If you are sexually active, practice safe sex. Use a condom or other form of birth control (contraception) in order to prevent pregnancy and STIs (sexually transmitted infections). If you plan to become pregnant, see your health care provider for a preconception visit. What's next?  Visit your health care provider once a year for a well check visit.  Ask your health care provider how often you should have your eyes and teeth checked.  Stay up to date on all vaccines. This information is not intended to replace advice given to you by your health care provider. Make sure you discuss any questions you have with your health care provider. Document Revised: 04/24/2018 Document Reviewed: 04/24/2018 Elsevier Patient Education  2020 [employer].

## 2020-08-25 LAB — CYTOLOGY - PAP: Diagnosis: NEGATIVE

## 2020-09-08 ENCOUNTER — Emergency Department (HOSPITAL_COMMUNITY)

## 2020-09-08 ENCOUNTER — Encounter (HOSPITAL_COMMUNITY): Payer: Self-pay | Admitting: Emergency Medicine

## 2020-09-08 ENCOUNTER — Emergency Department (HOSPITAL_COMMUNITY)
Admission: EM | Admit: 2020-09-08 | Discharge: 2020-09-08 | Disposition: A | Discharge status: Home / Self Care | Attending: Emergency Medicine | Admitting: Emergency Medicine

## 2020-09-08 ENCOUNTER — Other Ambulatory Visit: Payer: Self-pay

## 2020-09-08 DIAGNOSIS — R002 Palpitations: Secondary | ICD-10-CM | POA: Diagnosis not present

## 2020-09-08 LAB — BASIC METABOLIC PANEL
Anion gap: 14 (ref 5–15)
BUN: 9 mg/dL (ref 6–20)
CO2: 20 mmol/L — ABNORMAL LOW (ref 22–32)
Calcium: 9.6 mg/dL (ref 8.9–10.3)
Chloride: 106 mmol/L (ref 98–111)
Creatinine, Ser: 1.03 mg/dL — ABNORMAL HIGH (ref 0.44–1.00)
GFR, Estimated: >60 mL/min (ref >60)
Glucose, Bld: 95 mg/dL (ref 70–99)
Potassium: 4.7 mmol/L (ref 3.5–5.1)
Sodium: 140 mmol/L (ref 135–145)

## 2020-09-08 LAB — I-STAT BETA HCG BLOOD, ED (MC, WL, AP ONLY): I-stat hCG, quantitative: <5 m[IU]/mL (ref <5)

## 2020-09-08 LAB — CBC
HCT: 41.2 % (ref 36.0–46.0)
Hemoglobin: 14.3 g/dL (ref 12.0–15.0)
MCH: 31.7 pg (ref 26.0–34.0)
MCHC: 34.7 g/dL (ref 30.0–36.0)
MCV: 91.4 fL (ref 80.0–100.0)
Platelets: 322 10*3/uL (ref 150–400)
RBC: 4.51 MIL/uL (ref 3.87–5.11)
RDW: 12.4 % (ref 11.5–15.5)
WBC: 5.8 10*3/uL (ref 4.0–10.5)
nRBC: 0 % (ref 0.0–0.2)

## 2020-09-08 LAB — T4, FREE: Free T4: 1.3 ng/dL — ABNORMAL HIGH (ref 0.61–1.12)

## 2020-09-08 LAB — TSH: TSH: 1.108 u[IU]/mL (ref 0.350–4.500)

## 2020-09-08 LAB — TROPONIN I (HIGH SENSITIVITY)
Troponin I (High Sensitivity): <2 ng/L (ref <18)
Troponin I (High Sensitivity): <2 ng/L (ref <18)

## 2020-09-08 LAB — D-DIMER, QUANTITATIVE: D-Dimer, Quant: <0.27 ug/mL-FEU (ref 0.00–0.50)

## 2020-09-08 MED ORDER — SODIUM CHLORIDE 0.9 % IV BOLUS
500.0000 mL | Freq: Once | INTRAVENOUS | Status: AC
  Administered 2020-09-08: 500 mL via INTRAVENOUS

## 2020-09-08 NOTE — ED Provider Notes (Incomplete)
Shared service with APP.  I have personally seen and examined the patient, providing direct face to face care.  Physical exam findings and plan include  Evaluation for palpitations, transient shortness of breath.  Patient low risk for ACS and low risk for blood clots.  D-dimer ordered and reviewed negative.  Thyroid testing pending.  Patient symptoms resolved on reassessment. Normal cardiac and lung exam, well-appearing in the ER.  Tachycardia resolved.  Patient stable for outpatient follow-up.  No diagnosis found.

## 2020-09-08 NOTE — ED Provider Notes (Signed)
[facility] EMERGENCY DEPARTMENT Provider Note   CSN: [id number] Arrival date & time: 09/08/20  0859     History Chief Complaint  Patient presents with  . Tachycardia    Linda Bright is a 25 y.o. female history includes PTSD, otherwise healthy.  Takes BuSpar and is on Depo-Provera.  Patient presents today for concern of palpitations onset yesterday.  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  She felt well the rest of the day, last night she was asleep and was woken up by the same heart racing sensation.  Patient reports that she has never experienced this before.  Patient reports that the palpitations are associated with a shortness of breath which she describes as a difficulty catching her breath that resolves whenever her palpitations stop, so causes her to feel anxious.  She was speaking with her mother about the symptoms earlier today and her mother was concerned for possible Graves' disease as she was diagnosed around the same age with the same complaints.  Patient denies any recent illnesses, fever/chills, headaches, chest pain, cough/hemoptysis, abdominal Pain, nausea/vomiting, diarrhea, extremity swelling/color change, history of blood clot, exogenous estrogen use, recent surgery/immobilizations, presyncope/syncope, family history of sudden death or any additional concerns.  HPI     History reviewed. No pertinent past medical history.  There are no problems to display for this patient.   Past Surgical History:  Procedure Laterality Date  . NO PAST SURGERIES       OB History   No obstetric history on file.     Family History  Problem Relation Age of Onset  . Graves' disease Mother   . CVA Father   . Asthma Brother   . Sleep apnea Brother   . Diabetes  Paternal Grandmother   . Breast cancer Paternal Aunt   . Colon cancer Neg Hx     Social History   Tobacco Use  . Smoking status: Never Smoker  . Smokeless tobacco: Never Used  Vaping Use  . Vaping Use: Never used  Substance Use Topics  . Alcohol use: Yes    Alcohol/week: 2.0 standard drinks    Types: 2 Cans of beer per week  . Drug use: Never    Home Medications Prior to Admission medications   Medication Sig Start Date End Date Taking? Authorizing Provider  buPROPion (WELLBUTRIN SR) 150 MG 12 hr tablet Take 1 tablet (150 mg total) by mouth 2 (two) times daily. 06/01/20 11/28/20 Yes Pollak, Lavella Hammock, PA-C  busPIRone (BUSPAR) 10 MG tablet Take 10 mg by mouth at bedtime as needed (for sleep and anxiety).   Yes [provider]  sertraline (ZOLOFT) 25 MG tablet Take 1 tablet (25 mg total) by mouth daily. Patient not taking: Reported on 09/08/2020 03/18/20   Trey Sailors, PA-C    Allergies    Advil [ibuprofen]  Review of Systems   Review of Systems Ten systems are reviewed and are negative for acute change except as noted in the HPI Physical Exam Updated Vital Signs BP 107/67   Pulse 88   Temp 98.7 F (37.1 C) (Oral)   Resp (!) 9   SpO2 100%   Physical Exam Constitutional:      General: She is not in acute distress.    Appearance: Normal appearance. She is well-developed. She  is not ill-appearing or diaphoretic.  HENT:     Head: Normocephalic and atraumatic.  Eyes:     General: Vision grossly intact. Gaze aligned appropriately.     Pupils: Pupils are equal, round, and reactive to light.  Neck:     Trachea: Trachea and phonation normal.  Cardiovascular:     Rate and Rhythm: Normal rate and regular rhythm.     Pulses: Normal pulses.     Heart sounds: Normal heart sounds.  Pulmonary:     Effort: Pulmonary effort is normal. No respiratory distress.     Breath sounds: Normal breath sounds.  Abdominal:     General: There is no distension.     Palpations:  Abdomen is soft.     Tenderness: There is no abdominal tenderness. There is no guarding or rebound.  Musculoskeletal:        General: Normal range of motion.     Cervical back: Normal range of motion.     Right lower leg: No edema.     Left lower leg: No edema.  Skin:    General: Skin is warm and dry.  Neurological:     Mental Status: She is alert.     GCS: GCS eye subscore is 4. GCS verbal subscore is 5. GCS motor subscore is 6.     Comments: Speech is clear and goal oriented, follows commands Major Cranial nerves without deficit, no facial droop Moves extremities without ataxia, coordination intact  Psychiatric:        Behavior: Behavior normal.     ED Results / Procedures / Treatments   Labs (all labs ordered are listed, but only abnormal results are displayed) Labs Reviewed  BASIC METABOLIC PANEL - Abnormal; Notable for the following components:      Result Value   CO2 20 (*)    Creatinine, Ser 1.03 (*)    All other components within normal limits  T4, FREE - Abnormal; Notable for the following components:   Free T4 1.30 (*)    All other components within normal limits  CBC  TSH  D-DIMER, QUANTITATIVE (NOT AT [facility])  T3, FREE  I-STAT BETA HCG BLOOD, ED ([facility], [facility], [facility] ONLY)  TROPONIN I (HIGH SENSITIVITY)  TROPONIN I (HIGH SENSITIVITY)    EKG EKG Interpretation  Date/Time:  Thursday September 08 2020 09:04:32 EST Ventricular Rate:  95 PR Interval:  134 QRS Duration: 64 QT Interval:  332 QTC Calculation: 417 R Axis:   88 Text Interpretation: Normal sinus rhythm with sinus arrhythmia Normal ECG Confirmed by Blane Ohara [phone]) on 09/08/2020 11:12:21 AM   Radiology DG Chest Portable 1 View  Result Date: 09/08/2020 CLINICAL DATA:  Tachycardia EXAM: PORTABLE CHEST 1 VIEW COMPARISON:  08/28/2017 FINDINGS: The heart size and mediastinal contours are within normal limits. No focal airspace consolidation, pleural effusion, or pneumothorax. The visualized skeletal  structures are unremarkable. IMPRESSION: No active disease. Electronically Signed   By: Duanne Guess D.O.   On: 09/08/2020 10:56    Procedures Procedures (including critical care time)  Medications Ordered in ED Medications  sodium chloride 0.9 % bolus 500 mL (0 mLs Intravenous Stopped 09/08/20 1342)    ED Course  I have reviewed the triage vital signs and the nursing notes.  Pertinent labs & imaging results that were available during my care of the patient were reviewed by me and considered in my medical decision making (see chart for details).    MDM Rules/Calculators/A&P  Additional history obtained from: 1. Nursing notes from this visit. 2. Review of electronic medical records. ---------------------- I ordered, reviewed and interpreted labs which include: CBC within normal limits Pregnancy test negative D-dimer negative High-sensitivity troponin negative x2 BMP shows minimally elevated creatinine 1.03 TSH within normal limits T4 minimally elevated at 1.3 T3 pending  EKG: Normal sinus rhythm with sinus arrhythmia Normal ECG Confirmed by Blane Ohara [phone]) on 09/08/2020 11:12:21 AM  CXR:    IMPRESSION:  No active disease.  - Patient reassessed she is resting comfortably in bed no acute distress has back to lunch with her.  She reports she is feeling well, no recurrence of symptoms while in the ER.  She received IV fluids for minimal dehydration on labs.  Work-up today is reassuring doubt ACS, PE, dissection or other emergent cardiopulmonary pathologies at this time.  Additionally no evidence of arrhythmia on cardiac monitor while in the ER.  TSH was within normal limits, T4 minimally elevated she will follow-up with her PCP for reassessment.  No high risk family history for sudden cardiac death.  Possible anxiety component to symptoms today. Patient appears stable for PCP follow-up.  At this time there does not appear to be any evidence of an  acute emergency medical condition and the patient appears stable for discharge with appropriate outpatient follow up. Diagnosis was discussed with patient who verbalizes understanding of care plan and is agreeable to discharge. I have discussed return precautions with patient who verbalizes understanding. Patient encouraged to follow-up with their PCP. All questions answered.  Patient seen and evaluated by Dr. Jodi Mourning during this visit who agrees with discharge and PCP follow-up.  Note: Portions of this report may have been transcribed using voice recognition software. Every effort was made to ensure accuracy; however, inadvertent computerized transcription errors may still be present. Final Clinical Impression(s) / ED Diagnoses Final diagnoses:  Palpitations    Rx / DC Orders ED Discharge Orders    None       Elizabeth Palau 09/08/20 1352    Blane Ohara, MD 09/13/20 1419

## 2020-09-08 NOTE — ED Triage Notes (Signed)
Patient complains of intermittent tachycardia and anxiety that started yesterday, states mother was diagnosed with Grave's disease at same age. Denies other complaints.

## 2020-09-08 NOTE — Discharge Instructions (Addendum)
At this time there does not appear to be the presence of an emergent medical condition, however there is always the potential for conditions to change. Please read and follow the below instructions.  Please return to the Emergency Department immediately for any new or worsening symptoms. Please be sure to follow up with your Primary Care Provider within one week regarding your visit today; please call their office to schedule an appointment even if you are feeling better for a follow-up visit. Please drink plenty water and get plenty of rest.  Please have your primary care provider recheck labs follow-up visit.  One of your thyroid test is currently pending please review that on your MyChart account and discuss it with your primary care provider at your follow-up visit.  Go to the nearest Emergency Department immediately if: You have fever or chills Have chest pain. Feel short of breath. Have a very bad headache. Feel dizzy. Pass out (faint). You have any new/concerning or worsening of symptoms   Please read the additional information packets attached to your discharge summary.  Do not take your medicine if  develop an itchy rash, swelling in your mouth or lips, or difficulty breathing; call 911 and seek immediate emergency medical attention if this occurs.  You may review your lab tests and imaging results in their entirety on your MyChart account.  Please discuss all results of fully with your primary care provider and other specialist at your follow-up visit.  Note: Portions of this text may have been transcribed using voice recognition software. Every effort was made to ensure accuracy; however, inadvertent computerized transcription errors may still be present.

## 2020-09-09 LAB — T3, FREE: T3, Free: 4.1 pg/mL (ref 2.0–4.4)

## 2020-09-13 ENCOUNTER — Telehealth (INDEPENDENT_AMBULATORY_CARE_PROVIDER_SITE_OTHER): Admitting: Physician Assistant

## 2020-09-13 ENCOUNTER — Encounter: Payer: Self-pay | Admitting: Physician Assistant

## 2020-09-13 DIAGNOSIS — I498 Other specified cardiac arrhythmias: Secondary | ICD-10-CM

## 2020-09-13 DIAGNOSIS — F32A Depression, unspecified: Secondary | ICD-10-CM

## 2020-09-13 DIAGNOSIS — F419 Anxiety disorder, unspecified: Secondary | ICD-10-CM | POA: Diagnosis not present

## 2020-09-13 MED ORDER — BUPROPION HCL ER (SR) 100 MG PO TB12: 100.0000 mg | ORAL_TABLET | Freq: Every day | ORAL | 0 refills | Status: DC

## 2020-09-13 NOTE — Progress Notes (Signed)
MyChart Video Visit    Virtual Visit via Video Note   This visit type was conducted due to national recommendations for restrictions regarding the COVID-19 Pandemic (e.g. social distancing) in an effort to limit this patient's exposure and mitigate transmission in our community. This patient is at least at moderate risk for complications without adequate follow up. This format is felt to be most appropriate for this patient at this time. Physical exam was limited by quality of the video and audio technology used for the visit.   Patient location: home Provider location: office  I discussed the limitations of evaluation and management by telemedicine and the availability of in person appointments. The patient expressed understanding and agreed to proceed.  Patient: Linda Bright   DOB: 06-27-1996   24 y.o. Female  MRN: [id number] Visit Date: 09/13/2020  Today's healthcare provider: Trey Sailors, PA-C   Chief Complaint  Patient presents with  . ER Follow-up  I,Porsha C McClurkin,acting as a scribe for Trey Sailors, PA-C.,have documented all relevant documentation on the behalf of Trey Sailors, PA-C,as directed by  Trey Sailors, PA-C while in the presence of Trey Sailors, PA-C.  Subjective    HPI  Follow up ER visit  Patient was seen in ER for tachycardia on 09/08/2020. She woke up early Thursday morning with her heat pounding and racing. She thought at first this may be a panic attack but she reports the feeling didn't stop. She did not have anything to be anxious about. She reports having tremors in her hands and tightness in her chest.  She was treated for palpitations. Treatment for this included EKG, labs,Chest x-ray. She reports good compliance with treatment. She reports this condition is Unchanged. Reports resting heart rate was 120 at ER. Her normal heart rate is 50 bpm. She started wellbutrin 05/2020.    Pulse Readings from Last 3 Encounters:   09/08/20 88  08/24/20 66  06/01/20 66     -----------------------------------------------------------------------------------------      Medications: Outpatient Medications Prior to Visit  Medication Sig  . buPROPion (WELLBUTRIN SR) 150 MG 12 hr tablet Take 1 tablet (150 mg total) by mouth 2 (two) times daily.  . busPIRone (BUSPAR) 10 MG tablet Take 10 mg by mouth at bedtime as needed (for sleep and anxiety).  . sertraline (ZOLOFT) 25 MG tablet Take 1 tablet (25 mg total) by mouth daily. (Patient not taking: No sig reported)   No facility-administered medications prior to visit.    Review of Systems  Constitutional: Negative.   Respiratory: Positive for chest tightness.   Cardiovascular: Positive for palpitations.      Objective    There were no vitals taken for this visit.   Physical Exam Constitutional:      Appearance: Normal appearance.  Pulmonary:     Effort: Pulmonary effort is normal. No respiratory distress.  Neurological:     Mental Status: She is alert.  Psychiatric:        Mood and Affect: Mood normal.        Behavior: Behavior normal.        Assessment & Plan    1. Atrial bigeminy  Captured on initial EKG but ultimately resolved during ER visit. TSH normal with slightly elevated T4. Suspect side effect of wellbutrin. Will lower dose. If tachycardia persists, will likely need to discontinue medication and find alternative. F/u 2 months.   - buPROPion (WELLBUTRIN SR) 100 MG 12 hr tablet; Take 1 tablet (  100 mg total) by mouth daily.  Dispense: 90 tablet; Refill: 0  2. Anxiety and depression  Lower dose of wellbutrin.   - buPROPion (WELLBUTRIN SR) 100 MG 12 hr tablet; Take 1 tablet (100 mg total) by mouth daily.  Dispense: 90 tablet; Refill: 0   No follow-ups on file.     I discussed the assessment and treatment plan with the patient. The patient was provided an opportunity to ask questions and all were answered. The patient agreed with the  plan and demonstrated an understanding of the instructions.   The patient was advised to call back or seek an in-person evaluation if the symptoms worsen or if the condition fails to improve as anticipated.   ITrey Sailors, PA-C, have reviewed all documentation for this visit. The documentation on 09/13/20 for the exam, diagnosis, procedures, and orders are all accurate and complete.  The entirety of the information documented in the History of Present Illness, Review of Systems and Physical Exam were personally obtained by me. Portions of this information were initially documented by [facility] and reviewed by me for thoroughness and accuracy.    Maryella Shivers [facility] [phone] (phone) [phone] (fax)  [facility]

## 2020-09-13 NOTE — Telephone Encounter (Signed)
Patient was called .

## 2020-11-14 ENCOUNTER — Encounter: Payer: Self-pay | Admitting: Family Medicine

## 2020-11-14 ENCOUNTER — Other Ambulatory Visit: Payer: Self-pay

## 2020-11-14 ENCOUNTER — Ambulatory Visit: Admitting: Physician Assistant

## 2020-11-14 ENCOUNTER — Ambulatory Visit (INDEPENDENT_AMBULATORY_CARE_PROVIDER_SITE_OTHER): Admitting: Family Medicine

## 2020-11-14 DIAGNOSIS — Z309 Encounter for contraceptive management, unspecified: Secondary | ICD-10-CM | POA: Diagnosis not present

## 2020-11-14 MED ORDER — MEDROXYPROGESTERONE ACETATE 150 MG/ML IM SUSP
150.0000 mg | Freq: Once | INTRAMUSCULAR | Status: AC
  Administered 2020-11-14: 150 mg via INTRAMUSCULAR

## 2020-11-14 NOTE — Progress Notes (Signed)
      Established patient visit   Patient: Linda Bright   DOB: Aug 03, 1996   24 y.o. Female  MRN: [id number] Visit Date: 11/14/2020  Today's healthcare provider: Shirlee Latch, MD   Chief Complaint  Patient presents with  . Contraception  Set designer as a Neurosurgeon for Shirlee Latch, MD.,have documented all relevant documentation on the behalf of Shirlee Latch, MD,as directed by  Shirlee Latch, MD while in the presence of Shirlee Latch, MD.  Subjective    HPI  Depo-Provera Patient presents today for depo-provera injection. She received her last one on 08/24/2020 and tolerated the injection well. Patient received injection today in right ventrogluteal.She is due back on or between June 6- June 20.     Medications: Outpatient Medications Prior to Visit  Medication Sig  . buPROPion (WELLBUTRIN SR) 100 MG 12 hr tablet Take 1 tablet (100 mg total) by mouth daily.  . busPIRone (BUSPAR) 10 MG tablet Take 10 mg by mouth at bedtime as needed (for sleep and anxiety).  . sertraline (ZOLOFT) 25 MG tablet Take 1 tablet (25 mg total) by mouth daily. (Patient not taking: No sig reported)   No facility-administered medications prior to visit.    Review of Systems     Objective    There were no vitals taken for this visit.    Physical Exam    No results found for any visits on 11/14/20.  Assessment & Plan     1. Encounter for contraceptive management, unspecified type - patient was given Depo provera by CMA. I did not examine the patient personally. - medroxyPROGESTERone (DEPO-PROVERA) injection 150 mg   Return in about 3 months (around 02/14/2021) for next Depo shot.      I, Shirlee Latch, MD, have reviewed all documentation for this visit. The documentation on 11/14/20 for the exam, diagnosis, procedures, and orders are all accurate and complete.   Meosha Castanon, Marzella Schlein, MD, MPH [facility]

## 2020-11-15 ENCOUNTER — Ambulatory Visit: Admitting: Family Medicine

## 2020-11-19 ENCOUNTER — Encounter (HOSPITAL_COMMUNITY): Payer: Self-pay

## 2020-11-19 ENCOUNTER — Emergency Department (HOSPITAL_COMMUNITY)
Admission: EM | Admit: 2020-11-19 | Discharge: 2020-11-19 | Disposition: A | Discharge status: Home / Self Care | Attending: Emergency Medicine | Admitting: Emergency Medicine

## 2020-11-19 DIAGNOSIS — Z7721 Contact with and (suspected) exposure to potentially hazardous body fluids: Secondary | ICD-10-CM | POA: Diagnosis not present

## 2020-11-19 LAB — RAPID HIV SCREEN (HIV 1/2 AB+AG)
HIV 1/2 Antibodies: NONREACTIVE
HIV-1 P24 Antigen - HIV24: NONREACTIVE

## 2020-11-19 LAB — COMPREHENSIVE METABOLIC PANEL
ALT: 23 U/L (ref 0–44)
AST: 27 U/L (ref 15–41)
Albumin: 4.4 g/dL (ref 3.5–5.0)
Alkaline Phosphatase: 47 U/L (ref 38–126)
Anion gap: 11 (ref 5–15)
BUN: 10 mg/dL (ref 6–20)
CO2: 20 mmol/L — ABNORMAL LOW (ref 22–32)
Calcium: 9.5 mg/dL (ref 8.9–10.3)
Chloride: 108 mmol/L (ref 98–111)
Creatinine, Ser: 0.84 mg/dL (ref 0.44–1.00)
GFR, Estimated: >60 mL/min (ref >60)
Glucose, Bld: 104 mg/dL — ABNORMAL HIGH (ref 70–99)
Potassium: 3.6 mmol/L (ref 3.5–5.1)
Sodium: 139 mmol/L (ref 135–145)
Total Bilirubin: 0.5 mg/dL (ref 0.3–1.2)
Total Protein: 7.8 g/dL (ref 6.5–8.1)

## 2020-11-19 LAB — I-STAT BETA HCG BLOOD, ED (MC, WL, AP ONLY): I-stat hCG, quantitative: <5 m[IU]/mL (ref <5)

## 2020-11-19 LAB — HEPATITIS C ANTIBODY: HCV Ab: NONREACTIVE

## 2020-11-19 LAB — HEPATITIS B SURFACE ANTIGEN: Hepatitis B Surface Ag: NONREACTIVE
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
## 2020-11-20 LAB — RPR: RPR Ser Ql: NONREACTIVE

## 2020-12-01 ENCOUNTER — Other Ambulatory Visit: Payer: Self-pay | Admitting: Physician Assistant

## 2020-12-01 DIAGNOSIS — F32A Depression, unspecified: Secondary | ICD-10-CM

## 2020-12-11 ENCOUNTER — Other Ambulatory Visit: Payer: Self-pay | Admitting: Physician Assistant

## 2020-12-11 DIAGNOSIS — I498 Other specified cardiac arrhythmias: Secondary | ICD-10-CM

## 2020-12-11 DIAGNOSIS — F419 Anxiety disorder, unspecified: Secondary | ICD-10-CM

## 2020-12-26 ENCOUNTER — Ambulatory Visit: Payer: Self-pay | Admitting: Dermatology

## 2020-12-28 ENCOUNTER — Other Ambulatory Visit: Payer: Self-pay

## 2020-12-28 ENCOUNTER — Encounter (HOSPITAL_COMMUNITY): Payer: Self-pay | Admitting: Emergency Medicine

## 2020-12-28 ENCOUNTER — Ambulatory Visit (HOSPITAL_COMMUNITY)
Admission: EM | Admit: 2020-12-28 | Discharge: 2020-12-28 | Disposition: A | Discharge status: Home / Self Care | Attending: Family Medicine | Admitting: Family Medicine

## 2020-12-28 DIAGNOSIS — L03012 Cellulitis of left finger: Secondary | ICD-10-CM

## 2020-12-28 MED ORDER — SULFAMETHOXAZOLE-TRIMETHOPRIM 800-160 MG PO TABS: 1.0000 | ORAL_TABLET | Freq: Two times a day (BID) | ORAL | 0 refills | Status: DC

## 2020-12-28 NOTE — ED Triage Notes (Signed)
Patient has issues with left and right middle fingers.  Left middle finger has open area at cuticle, painful, antibiotic ointment and flushing wound with hydrogen peroxide.  Right middle finger has swelling at cuticle edge.  Both are painful

## 2020-12-28 NOTE — ED Provider Notes (Signed)
[facility]   [id number] 12/28/20 Arrival Time: 1722  ASSESSMENT & PLAN:  1. Paronychia of finger, left    Stop Neosporin and H2O2. No areas requiring I&D at this time.  Begin: Meds ordered this encounter  Medications  . sulfamethoxazole-trimethoprim (BACTRIM DS) 800-160 MG tablet    Sig: Take 1 tablet by mouth 2 (two) times daily.    Dispense:  20 tablet    Refill:  0     Follow-up Information    [facility].   Specialty: Urgent Care Why: If worsening or failing to improve as anticipated. Contact information: [address] [place] [address] [phone]              Reviewed expectations re: course of current medical issues. Questions answered. Outlined signs and symptoms indicating need for more acute intervention. Understanding verbalized. After Visit Summary given.   SUBJECTIVE: History from: patient. Linda Bright is a 25 y.o. female who reports questionable infection of nail fold of L 3rd finger mainly; slight changes of right 3rd finger also. Over past week. Painful. Serous/slightly yellow drainage. Afebrile. No injuries known. Does not bite nails. Neosporin and H2O2 soaks without much relief.   OBJECTIVE:  Vitals:   12/28/20 1802  BP: 113/61  Pulse: 99  Resp: 18  Temp: 98 F (36.7 C)  TempSrc: Oral  SpO2: 100%    General appearance: alert; no distress Extremities: left 3rd finger with erythema/irritation/slight swelling around lateral nail fold with serous/yellowish drainage; very TTP; subtle similar around R 3rd nailfold; no areas of fluctuance Psychological: alert and cooperative; normal mood and affect  Allergies  Allergen Reactions  . Advil [Ibuprofen]     Rash    History reviewed. No pertinent past medical history. Social History   Socioeconomic History  . Marital status: Married    Spouse name: Not on file  . Number of children: Not on file  . Years of education: Not  on file  . Highest education level: Not on file  Occupational History  . Not on file  Tobacco Use  . Smoking status: Never Smoker  . Smokeless tobacco: Never Used  Vaping Use  . Vaping Use: Never used  Substance and Sexual Activity  . Alcohol use: Yes    Alcohol/week: 2.0 standard drinks    Types: 2 Cans of beer per week  . Drug use: Never  . Sexual activity: Yes  Other Topics Concern  . Not on file  Social History Narrative  . Not on file   Social Determinants of Health   Financial Resource Strain: Low Risk   . Difficulty of Paying Living Expenses: Not hard at all  Food Insecurity: No Food Insecurity  . Worried About Programme researcher, broadcasting/film/video in the Last Year: Never true  . Ran Out of Food in the Last Year: Never true  Transportation Needs: Not on file  Physical Activity: Sufficiently Active  . Days of Exercise per Week: 5 days  . Minutes of Exercise per Session: 50 min  Stress: Stress Concern Present  . Feeling of Stress : Rather much  Social Connections: Moderately Isolated  . Frequency of Communication with Friends and Family: Three times a week  . Frequency of Social Gatherings with Friends and Family: Three times a week  . Attends Religious Services: Never  . Active Member of Clubs or Organizations: No  . Attends Banker Meetings: Never  . Marital Status: Married  Catering manager Violence: Not  At Risk  . Fear of Current or Ex-Partner: No  . Emotionally Abused: No  . Physically Abused: No  . Sexually Abused: No   Family History  Problem Relation Age of Onset  . Graves' disease Mother   . CVA Father   . Asthma Brother   . Sleep apnea Brother   . Diabetes Paternal Grandmother   . Breast cancer Paternal Aunt   . Colon cancer Neg Hx    Past Surgical History:  Procedure Laterality Date  . NO PAST SURGERIES       Mardella Layman, MD 12/28/20 1827

## 2021-01-10 ENCOUNTER — Other Ambulatory Visit: Payer: Self-pay

## 2021-01-10 ENCOUNTER — Ambulatory Visit: Admitting: Family Medicine

## 2021-01-10 ENCOUNTER — Ambulatory Visit
Admission: EM | Admit: 2021-01-10 | Discharge: 2021-01-10 | Disposition: A | Discharge status: Home / Self Care | Attending: Emergency Medicine | Admitting: Emergency Medicine

## 2021-01-10 ENCOUNTER — Ambulatory Visit: Payer: Self-pay

## 2021-01-10 DIAGNOSIS — L03012 Cellulitis of left finger: Secondary | ICD-10-CM

## 2021-01-10 DIAGNOSIS — M545 Low back pain, unspecified: Secondary | ICD-10-CM

## 2021-01-10 MED ORDER — METHOCARBAMOL 500 MG PO TABS: 500.0000 mg | ORAL_TABLET | Freq: Two times a day (BID) | ORAL | 0 refills | Status: DC | PRN

## 2021-01-10 MED ORDER — CEPHALEXIN 500 MG PO CAPS: 500.0000 mg | ORAL_CAPSULE | Freq: Four times a day (QID) | ORAL | 0 refills | Status: DC

## 2021-01-10 NOTE — ED Provider Notes (Signed)
Linda Bright    CSN: [id number] Arrival date & time: 01/10/21  1232      History   Chief Complaint Chief Complaint  Patient presents with  . Hand Pain  . Back Pain    HPI Linda Bright is a 25 y.o. female.   Patient presents with ongoing redness, pus, tenderness to left middle finger paronychia.  She was seen at [facility] urgent care on 12/28/2020; diagnosed with paronychia; treated with Bactrim DS.  She reports she completed the antibiotic.  Patient also reports mid to lower back pain intermittently x2 months.  No falls or injury.  She denies fever, chills, abdominal pain, dysuria, hematuria, numbness, weakness, paresthesias, saddle anesthesia, loss of bowel/bladder control, or other symptoms.  No pertinent medical history.  The history is provided by the patient and medical records.    History reviewed. No pertinent past medical history.  There are no problems to display for this patient.   Past Surgical History:  Procedure Laterality Date  . NO PAST SURGERIES      OB History   No obstetric history on file.      Home Medications    Prior to Admission medications   Medication Sig Start Date End Date Taking? Authorizing Provider  cephALEXin (KEFLEX) 500 MG capsule Take 1 capsule (500 mg total) by mouth 4 (four) times daily. 01/10/21  Yes Mickie Bail, NP  methocarbamol (ROBAXIN) 500 MG tablet Take 1 tablet (500 mg total) by mouth 2 (two) times daily as needed for muscle spasms. 01/10/21  Yes Mickie Bail, NP  buPROPion [facility] SR) 100 MG 12 hr tablet TAKE 1 TABLET(100 MG) BY MOUTH DAILY 12/12/20   Malva Limes, MD  ISOtretinoin (ACCUTANE) 40 MG capsule Take 40 mg by mouth 2 (two) times daily.    [provider]  sertraline (ZOLOFT) 25 MG tablet Take 1 tablet (25 mg total) by mouth daily. Patient not taking: No sig reported 03/18/20 01/10/21  Trey Sailors, PA-C    Family History Family History  Problem Relation Age of Onset  . Graves'  disease Mother   . CVA Father   . Asthma Brother   . Sleep apnea Brother   . Diabetes Paternal Grandmother   . Breast cancer Paternal Aunt   . Colon cancer Neg Hx     Social History Social History   Tobacco Use  . Smoking status: Never Smoker  . Smokeless tobacco: Never Used  Vaping Use  . Vaping Use: Never used  Substance Use Topics  . Alcohol use: Yes    Alcohol/week: 2.0 standard drinks    Types: 2 Cans of beer per week  . Drug use: Never     Allergies   Advil [ibuprofen]   Review of Systems Review of Systems  Constitutional: Negative for chills and fever.  Respiratory: Negative for cough and shortness of breath.   Cardiovascular: Negative for chest pain and palpitations.  Gastrointestinal: Negative for abdominal pain, diarrhea and vomiting.  Genitourinary: Negative for dysuria, hematuria, pelvic pain and vaginal discharge.  Musculoskeletal: Positive for back pain. Negative for arthralgias and gait problem.  Skin: Positive for color change and wound.  Neurological: Negative for weakness and numbness.  All other systems reviewed and are negative.    Physical Exam Triage Vital Signs ED Triage Vitals  Enc Vitals Group     BP 01/10/21 1241 108/70     Pulse Rate 01/10/21 1241 75     Resp 01/10/21 1241 19  Temp 01/10/21 1241 98.7 F (37.1 C)     Temp src --      SpO2 01/10/21 1241 97 %     Weight --      Height --      Head Circumference --      Peak Flow --      Pain Score 01/10/21 1239 0     Pain Loc --      Pain Edu? --      Excl. in GC? --    No data found.  Updated Vital Signs BP 108/70   Pulse 75   Temp 98.7 F (37.1 C)   Resp 19   SpO2 97%   Visual Acuity Right Eye Distance:   Left Eye Distance:   Bilateral Distance:    Right Eye Near:   Left Eye Near:    Bilateral Near:     Physical Exam Vitals and nursing note reviewed.  Constitutional:      General: She is not in acute distress.    Appearance: She is well-developed. She  is not ill-appearing.  HENT:     Head: Normocephalic and atraumatic.     Mouth/Throat:     Mouth: Mucous membranes are moist.  Eyes:     Conjunctiva/sclera: Conjunctivae normal.  Cardiovascular:     Rate and Rhythm: Normal rate and regular rhythm.     Heart sounds: Normal heart sounds.  Pulmonary:     Effort: Pulmonary effort is normal. No respiratory distress.     Breath sounds: Normal breath sounds.  Abdominal:     General: Bowel sounds are normal.     Palpations: Abdomen is soft.     Tenderness: There is no abdominal tenderness. There is no right CVA tenderness, left CVA tenderness, guarding or rebound.  Musculoskeletal:        General: No swelling, tenderness, deformity or signs of injury. Normal range of motion.     Cervical back: Neck supple.  Skin:    General: Skin is warm and dry.     Capillary Refill: Capillary refill takes less than 2 seconds.     Findings: Lesion present.     Comments: Tender, erythematous paronychia with scant purulent drainage on left middle finger.  Neurological:     General: No focal deficit present.     Mental Status: She is alert and oriented to person, place, and time.     Sensory: No sensory deficit.     Motor: No weakness.     Gait: Gait normal.  Psychiatric:        Mood and Affect: Mood normal.        Behavior: Behavior normal.      UC Treatments / Results  Labs (all labs ordered are listed, but only abnormal results are displayed) Labs Reviewed - No data to display  EKG   Radiology No results found.  Procedures Procedures (including critical care time)  Medications Ordered in UC Medications - No data to display  Initial Impression / Assessment and Plan / UC Course  I have reviewed the triage vital signs and the nursing notes.  Pertinent labs & imaging results that were available during my care of the patient were reviewed by me and considered in my medical decision making (see chart for details).   Left middle finger  paronychia.  Acute low back pain without sciatica.  Treating paronychia with cephalexin.  Wound care instructions discussed.  Instructed patient to follow-up with her PCP if her symptoms are  not improving.  Low back pain treated with Robaxin.  Precautions for drowsiness with this medication discussed.  Instructed patient to follow-up with an orthopedist if her symptoms are not improving.  Patient agrees to plan of care.   Final Clinical Impressions(s) / UC Diagnoses   Final diagnoses:  Paronychia of left middle finger  Acute bilateral low back pain without sciatica     Discharge Instructions     Take the antibiotic cephalexin as directed.  Keep your wound clean and dry.  Wash it gently twice a day with soap and water.  Apply an antibiotic cream twice a day.    Take the muscle relaxer as needed for muscle spasm; Do not drive, operate machinery, or drink alcohol with this medication as it can cause drowsiness.   Follow up with your primary care provider or an orthopedist if your symptoms are not improving.        ED Prescriptions    Medication Sig Dispense Auth. Provider   methocarbamol (ROBAXIN) 500 MG tablet Take 1 tablet (500 mg total) by mouth 2 (two) times daily as needed for muscle spasms. 10 tablet Mickie Bail, NP   cephALEXin (KEFLEX) 500 MG capsule Take 1 capsule (500 mg total) by mouth 4 (four) times daily. 28 capsule Mickie Bail, NP     PDMP not reviewed this encounter.   Mickie Bail, NP 01/10/21 1316

## 2021-01-10 NOTE — Progress Notes (Deleted)
      Established patient visit   Patient: Linda Bright   DOB: [birth year]   25 y.o. Female  MRN: [id number] Visit Date: 01/10/2021  Today's healthcare provider: Megan Mans, MD   No chief complaint on file.  Subjective    Hand Pain     ***  {Show patient history (optional):23778::" "}   Medications: Outpatient Medications Prior to Visit  Medication Sig  . buPROPion (WELLBUTRIN SR) 100 MG 12 hr tablet TAKE 1 TABLET(100 MG) BY MOUTH DAILY  . busPIRone (BUSPAR) 10 MG tablet Take 10 mg by mouth at bedtime as needed (for sleep and anxiety). (Patient not taking: Reported on 12/28/2020)  . ISOtretinoin (ACCUTANE) 40 MG capsule Take 40 mg by mouth 2 (two) times daily.  . sertraline (ZOLOFT) 25 MG tablet Take 1 tablet (25 mg total) by mouth daily. (Patient not taking: No sig reported)  . sulfamethoxazole-trimethoprim (BACTRIM DS) 800-160 MG tablet Take 1 tablet by mouth 2 (two) times daily.   No facility-administered medications prior to visit.    Review of Systems  {Labs  Heme  Chem  Endocrine  Serology  Results Review (optional):23779::" "}   Objective    There were no vitals taken for this visit. {Show previous vital signs (optional):23777::" "}   Physical Exam  ***  No results found for any visits on 01/10/21.  Assessment & Plan     ***  No follow-ups on file.      {provider attestation***:1}   Megan Mans, MD  [facility] [phone] (phone) [phone] (fax)  [facility]

## 2021-01-10 NOTE — Discharge Instructions (Addendum)
Take the antibiotic cephalexin as directed.  Keep your wound clean and dry.  Wash it gently twice a day with soap and water.  Apply an antibiotic cream twice a day.    Take the muscle relaxer as needed for muscle spasm; Do not drive, operate machinery, or drink alcohol with this medication as it can cause drowsiness.   Follow up with your primary care provider or an orthopedist if your symptoms are not improving.

## 2021-01-10 NOTE — ED Triage Notes (Addendum)
Patient has infected area to left middle finger since 5/2. Patient has completed antibiotics and reports it looks better. Pt still has red area to left middle finger that she is concerned about. Pt has kept the finger covered the entire time. Pt is concerned for another spot popping up on her right middle finger.  Pt also endorses back pain off and on for a couple months. 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 Reports throughout the day she feels like she has tightening in her back. Denies any injury.

## 2021-02-08 ENCOUNTER — Ambulatory Visit: Admitting: Family Medicine

## 2021-02-28 ENCOUNTER — Other Ambulatory Visit: Payer: Self-pay

## 2021-02-28 ENCOUNTER — Ambulatory Visit
Admission: EM | Admit: 2021-02-28 | Discharge: 2021-02-28 | Disposition: A | Discharge status: Home / Self Care | Attending: Emergency Medicine | Admitting: Emergency Medicine

## 2021-02-28 DIAGNOSIS — L03213 Periorbital cellulitis: Secondary | ICD-10-CM | POA: Diagnosis not present

## 2021-02-28 HISTORY — DX: Depression, unspecified: F32.A

## 2021-02-28 HISTORY — DX: Anxiety disorder, unspecified: F41.9

## 2021-02-28 MED ORDER — AMOXICILLIN-POT CLAVULANATE 875-125 MG PO TABS: 1.0000 | ORAL_TABLET | Freq: Two times a day (BID) | ORAL | 0 refills | Status: DC

## 2021-02-28 NOTE — Discharge Instructions (Addendum)
Take the antibiotic as directed.     Go to the emergency department if you have increased redness, increased swelling, acute eye pain, changes in your vision, or other concerning symptoms.

## 2021-02-28 NOTE — ED Provider Notes (Signed)
Linda Bright    CSN: [id number] Arrival date & time: 02/28/21  1053      History   Chief Complaint Chief Complaint  Patient presents with   Facial Swelling    HPI Linda Bright is a 25 y.o. female.  Patient presents with 1 day history of left eyelid swelling and redness.  No falls or injury.  When she woke up this morning, her eye was more swollen but reduced in size after warm compress.  She denies eye drainage, changes in vision, acute eye pain, fever, or other symptoms.  Treatment attempted at home with warm compress.   The history is provided by the patient.   Past Medical History:  Diagnosis Date   Anxiety    Depression     There are no problems to display for this patient.   Past Surgical History:  Procedure Laterality Date   NO PAST SURGERIES      OB History   No obstetric history on file.      Home Medications    Prior to Admission medications   Medication Sig Start Date End Date Taking? Authorizing Provider  amoxicillin-clavulanate (AUGMENTIN) 875-125 MG tablet Take 1 tablet by mouth every 12 (twelve) hours. 02/28/21  Yes Mickie Bail, NP  buPROPion [facility] SR) 100 MG 12 hr tablet TAKE 1 TABLET(100 MG) BY MOUTH DAILY 12/12/20  Yes Malva Limes, MD  ISOtretinoin (ACCUTANE) 40 MG capsule Take 40 mg by mouth 2 (two) times daily.    [provider]  methocarbamol (ROBAXIN) 500 MG tablet Take 1 tablet (500 mg total) by mouth 2 (two) times daily as needed for muscle spasms. 01/10/21   Mickie Bail, NP  sertraline (ZOLOFT) 25 MG tablet Take 1 tablet (25 mg total) by mouth daily. Patient not taking: No sig reported 03/18/20 01/10/21  Trey Sailors, PA-C    Family History Family History  Problem Relation Age of Onset   Graves' disease Mother    CVA Father    Asthma Brother    Sleep apnea Brother    Diabetes Paternal Grandmother    Breast cancer Paternal Aunt    Colon cancer Neg Hx     Social History Social History    Tobacco Use   Smoking status: Never   Smokeless tobacco: Never  Vaping Use   Vaping Use: Never used  Substance Use Topics   Alcohol use: Yes    Alcohol/week: 2.0 standard drinks    Types: 2 Cans of beer per week   Drug use: Never     Allergies   Advil [ibuprofen]   Review of Systems Review of Systems  Constitutional:  Negative for chills and fever.  HENT:  Negative for ear pain and sore throat.   Eyes:  Negative for pain, discharge, itching and visual disturbance.       Eyelid swelling and redness  Respiratory:  Negative for cough and shortness of breath.   Cardiovascular:  Negative for chest pain and palpitations.  Skin:  Negative for color change and rash.  All other systems reviewed and are negative.   Physical Exam Triage Vital Signs ED Triage Vitals  Enc Vitals Group     BP 02/28/21 1120 106/70     Pulse Rate 02/28/21 1120 69     Resp 02/28/21 1120 18     Temp 02/28/21 1120 98.9 F (37.2 C)     Temp Source 02/28/21 1120 Oral     SpO2 02/28/21 1120 99 %  Weight --      Height --      Head Circumference --      Peak Flow --      Pain Score 02/28/21 1123 0     Pain Loc --      Pain Edu? --      Excl. in GC? --    No data found.  Updated Vital Signs BP 106/70 (BP Location: Left Arm)   Pulse 69   Temp 98.9 F (37.2 C) (Oral)   Resp 18   SpO2 99%   Visual Acuity Right Eye Distance: 20/25 (corrected) Left Eye Distance: 20/16 (corrected) Bilateral Distance: 20/16 (corrected)  Right Eye Near:   Left Eye Near:    Bilateral Near:     Physical Exam Vitals and nursing note reviewed.  Constitutional:      General: She is not in acute distress.    Appearance: She is well-developed.  HENT:     Head: Normocephalic and atraumatic.     Right Ear: Tympanic membrane normal.     Left Ear: Tympanic membrane normal.     Nose: Nose normal.     Mouth/Throat:     Mouth: Mucous membranes are moist.     Pharynx: Oropharynx is clear.  Eyes:      Extraocular Movements: Extraocular movements intact.     Conjunctiva/sclera: Conjunctivae normal.     Pupils: Pupils are equal, round, and reactive to light.     Comments: Left lower eyelid mild erythema and mild edema.  No drainage or lesions.  Cardiovascular:     Rate and Rhythm: Normal rate and regular rhythm.     Heart sounds: No murmur heard. Pulmonary:     Effort: Pulmonary effort is normal. No respiratory distress.     Breath sounds: Normal breath sounds.  Abdominal:     Palpations: Abdomen is soft.     Tenderness: There is no abdominal tenderness.  Musculoskeletal:     Cervical back: Neck supple.  Skin:    General: Skin is warm and dry.  Neurological:     General: No focal deficit present.     Mental Status: She is alert and oriented to person, place, and time.     Gait: Gait normal.  Psychiatric:        Mood and Affect: Mood normal.        Behavior: Behavior normal.     UC Treatments / Results  Labs (all labs ordered are listed, but only abnormal results are displayed) Labs Reviewed - No data to display  EKG   Radiology No results found.  Procedures Procedures (including critical care time)  Medications Ordered in UC Medications - No data to display  Initial Impression / Assessment and Plan / UC Course  I have reviewed the triage vital signs and the nursing notes.  Pertinent labs & imaging results that were available during my care of the patient were reviewed by me and considered in my medical decision making (see chart for details).  Left lower eyelid preseptal cellulitis.  Patient's symptoms appear mild right now but she states they were worse this morning.  Discussed continued warm compresses.  Treating with Augmentin.  Strict ED precautions discussed.  Education provided on preseptal cellulitis.  Patient agrees to plan of care.   Final Clinical Impressions(s) / UC Diagnoses   Final diagnoses:  Preseptal cellulitis of left lower eyelid      Discharge Instructions      Take the antibiotic as directed.  Go to the emergency department if you have increased redness, increased swelling, acute eye pain, changes in your vision, or other concerning symptoms.         ED Prescriptions     Medication Sig Dispense Auth. Provider   amoxicillin-clavulanate (AUGMENTIN) 875-125 MG tablet Take 1 tablet by mouth every 12 (twelve) hours. 14 tablet Mickie Bail, NP      PDMP not reviewed this encounter.   Mickie Bail, NP 02/28/21 1158

## 2021-02-28 NOTE — ED Triage Notes (Signed)
Pt presents with L eye swelling starting yesterday then much worse this morning.  Some improvement after warm compress this morning.  Feels like something is in eye, lower lid.  Has h/o stye.  No redness or drainage.

## 2021-03-24 ENCOUNTER — Other Ambulatory Visit: Payer: Self-pay | Admitting: Family Medicine

## 2021-03-24 DIAGNOSIS — F419 Anxiety disorder, unspecified: Secondary | ICD-10-CM

## 2021-03-24 DIAGNOSIS — F32A Depression, unspecified: Secondary | ICD-10-CM

## 2021-03-24 DIAGNOSIS — I498 Other specified cardiac arrhythmias: Secondary | ICD-10-CM

## 2021-03-24 NOTE — Telephone Encounter (Signed)
Requested medications are due for refill today yes  Requested medications are on the active medication list yes  Last refill 5/3  Last visit 09/13/20  Future visit scheduled no, had two no shows and two cancellations with no upcoming visit scheduled.  Notes to clinic Please assess.

## 2021-03-27 NOTE — Telephone Encounter (Signed)
Please have her schedule appt before giving refill. See note regarding multiple no shows and cancellations.

## 2021-03-29 NOTE — Telephone Encounter (Signed)
L/M advising patient that appt is needed before further refills.

## 2021-05-02 ENCOUNTER — Ambulatory Visit: Admitting: Family Medicine

## 2021-05-03 ENCOUNTER — Ambulatory Visit (INDEPENDENT_AMBULATORY_CARE_PROVIDER_SITE_OTHER): Admitting: Family Medicine

## 2021-05-03 ENCOUNTER — Other Ambulatory Visit: Payer: Self-pay

## 2021-05-03 DIAGNOSIS — Z309 Encounter for contraceptive management, unspecified: Secondary | ICD-10-CM

## 2021-05-10 LAB — POCT URINE PREGNANCY: Preg Test, Ur: NEGATIVE

## 2021-05-10 MED ORDER — MEDROXYPROGESTERONE ACETATE 150 MG/ML IM SUSP
150.0000 mg | Freq: Once | INTRAMUSCULAR | Status: AC
  Administered 2021-05-03: 150 mg via INTRAMUSCULAR

## 2021-05-15 NOTE — Progress Notes (Signed)
Injection only. No MD visit.

## 2021-06-23 ENCOUNTER — Emergency Department (HOSPITAL_COMMUNITY): Payer: No Typology Code available for payment source

## 2021-06-23 ENCOUNTER — Encounter (HOSPITAL_COMMUNITY): Payer: Self-pay

## 2021-06-23 ENCOUNTER — Emergency Department (HOSPITAL_COMMUNITY)
Admission: EM | Admit: 2021-06-23 | Discharge: 2021-06-23 | Disposition: A | Discharge status: Home / Self Care | Payer: No Typology Code available for payment source | Attending: Emergency Medicine | Admitting: Emergency Medicine

## 2021-06-23 ENCOUNTER — Other Ambulatory Visit: Payer: Self-pay

## 2021-06-23 DIAGNOSIS — F17200 Nicotine dependence, unspecified, uncomplicated: Secondary | ICD-10-CM | POA: Diagnosis not present

## 2021-06-23 DIAGNOSIS — S161XXA Strain of muscle, fascia and tendon at neck level, initial encounter: Secondary | ICD-10-CM | POA: Diagnosis not present

## 2021-06-23 DIAGNOSIS — Y990 Civilian activity done for income or pay: Secondary | ICD-10-CM | POA: Diagnosis not present

## 2021-06-23 DIAGNOSIS — M79602 Pain in left arm: Secondary | ICD-10-CM | POA: Diagnosis not present

## 2021-06-23 DIAGNOSIS — S0990XA Unspecified injury of head, initial encounter: Secondary | ICD-10-CM | POA: Diagnosis not present

## 2021-06-23 DIAGNOSIS — S169XXA Unspecified injury of muscle, fascia and tendon at neck level, initial encounter: Secondary | ICD-10-CM | POA: Diagnosis present

## 2021-06-23 MED ORDER — METHOCARBAMOL 500 MG PO TABS: 500.0000 mg | ORAL_TABLET | Freq: Two times a day (BID) | ORAL | 0 refills | Status: DC

## 2021-06-23 MED ORDER — ACETAMINOPHEN 325 MG PO TABS
650.0000 mg | ORAL_TABLET | Freq: Once | ORAL | Status: AC
  Administered 2021-06-23: 650 mg via ORAL
  Filled 2021-06-23: qty 2

## 2021-06-23 NOTE — ED Notes (Signed)
Patient transported to X-ray 
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
## 2021-06-23 NOTE — Discharge Instructions (Addendum)
Your CT scan of the head and cervical spine were negative for any acute or traumatic injury. You will likely experience soreness in your muscles in the subsequent days to follow after this injury. Take Tylenol and the muscle relaxer as prescribed. You can use a heating pad, stretching massaging the area to help with pain as well. You may have a muscle strain in your left arm causing your pain.  If you start to develop worsening swelling or inability to move the arm you will need to return to the ER. Other things that would make you need to return to the ER would be severe headache, chest pain, trouble seeing, trouble ambulating, numbness in your arms or legs or subsequent head injury with loss of consciousness.  Make sure you are drinking plenty of fluids and staying hydrated.

## 2021-06-23 NOTE — ED Notes (Signed)
Pt ambulated in hallway without difficulty

## 2021-06-23 NOTE — ED Notes (Signed)
Pt teaching provided on medications that may cause drowsiness. Pt instructed not to drive or operate heavy machinery while taking the prescribed medication. Pt verbalized understanding.  ? ?Pt provided discharge instructions and prescription information. Pt was given the opportunity to ask questions and questions were answered. Discharge signature not obtained in the setting of the COVID-19 pandemic in order to reduce high touch surfaces.  ? ?
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
## 2021-07-19 ENCOUNTER — Encounter: Payer: Self-pay | Admitting: Family Medicine

## 2021-07-19 ENCOUNTER — Other Ambulatory Visit: Payer: Self-pay

## 2021-07-19 ENCOUNTER — Ambulatory Visit (INDEPENDENT_AMBULATORY_CARE_PROVIDER_SITE_OTHER): Admitting: Family Medicine

## 2021-07-19 VITALS — BP 107/74 | HR 83 | Resp 16 | Wt 134.3 lb

## 2021-07-19 DIAGNOSIS — Z23 Encounter for immunization: Secondary | ICD-10-CM | POA: Diagnosis not present

## 2021-07-19 DIAGNOSIS — R400 Somnolence: Secondary | ICD-10-CM

## 2021-07-19 DIAGNOSIS — Z309 Encounter for contraceptive management, unspecified: Secondary | ICD-10-CM | POA: Diagnosis not present

## 2021-07-19 DIAGNOSIS — R0683 Snoring: Secondary | ICD-10-CM

## 2021-07-19 DIAGNOSIS — M542 Cervicalgia: Secondary | ICD-10-CM | POA: Diagnosis not present

## 2021-07-19 DIAGNOSIS — R5383 Other fatigue: Secondary | ICD-10-CM | POA: Insufficient documentation

## 2021-07-19 DIAGNOSIS — R5382 Chronic fatigue, unspecified: Secondary | ICD-10-CM

## 2021-07-19 MED ORDER — MELOXICAM 15 MG PO TABS: 15.0000 mg | ORAL_TABLET | Freq: Every day | ORAL | 0 refills | Status: DC

## 2021-07-19 MED ORDER — MEDROXYPROGESTERONE ACETATE 150 MG/ML IM SUSP
150.0000 mg | Freq: Once | INTRAMUSCULAR | Status: AC
  Administered 2021-07-19: 150 mg via INTRAMUSCULAR

## 2021-07-19 MED ORDER — METHOCARBAMOL 500 MG PO TABS: 500.0000 mg | ORAL_TABLET | Freq: Three times a day (TID) | ORAL | 1 refills | Status: DC | PRN

## 2021-07-19 NOTE — Assessment & Plan Note (Signed)
Sexual active with same sex partner Use of depo for symptom management of ovarian cysts Doing well with continued use F/u in 3 months for additional dosing

## 2021-07-19 NOTE — Assessment & Plan Note (Signed)
Reports excessive daytime fatigue and sleepiness; has been consistent with caffeine intake Drinking 1 cup of coffee/day occ use of 1 energy drink at work for stressful/long shifts Denies sleep concerns or difficulty sleeping
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
## 2021-07-19 NOTE — Assessment & Plan Note (Signed)
Wife, complaints that partner snores loudly Interferes with her quality of sleep when asked to change sleeping positions by partner Hx of OSA in brother and in dad

## 2021-07-19 NOTE — Assessment & Plan Note (Signed)
Consent reviewed; provided 
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
## 2021-07-26 ENCOUNTER — Ambulatory Visit (INDEPENDENT_AMBULATORY_CARE_PROVIDER_SITE_OTHER): Admitting: Neurology

## 2021-07-26 ENCOUNTER — Encounter: Payer: Self-pay | Admitting: Neurology

## 2021-07-26 VITALS — BP 105/64 | HR 84 | Ht 61.0 in | Wt 134.0 lb

## 2021-07-26 DIAGNOSIS — R351 Nocturia: Secondary | ICD-10-CM | POA: Diagnosis not present

## 2021-07-26 DIAGNOSIS — G4719 Other hypersomnia: Secondary | ICD-10-CM

## 2021-07-26 DIAGNOSIS — E663 Overweight: Secondary | ICD-10-CM | POA: Diagnosis not present

## 2021-07-26 DIAGNOSIS — R0683 Snoring: Secondary | ICD-10-CM | POA: Diagnosis not present
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
## 2021-07-26 NOTE — Patient Instructions (Signed)

## 2021-08-15 ENCOUNTER — Other Ambulatory Visit: Payer: Self-pay | Admitting: Family Medicine

## 2021-08-15 DIAGNOSIS — M542 Cervicalgia: Secondary | ICD-10-CM

## 2021-08-15 NOTE — Telephone Encounter (Signed)
Requested Prescriptions  Pending Prescriptions Disp Refills   meloxicam (MOBIC) 15 MG tablet [Pharmacy Med Name: MELOXICAM 15MG  TABLETS] 30 tablet 2    Sig: TAKE 1 TABLET(15 MG) BY MOUTH DAILY     Analgesics:  COX2 Inhibitors Passed - 08/15/2021  3:33 AM      Passed - HGB in normal range and within 360 days    Hemoglobin  Date Value Ref Range Status  09/08/2020 14.3 12.0 - 15.0 g/dL Final  09/10/2020 76/16/0737 11.1 - 15.9 g/dL Final         Passed - Cr in normal range and within 360 days    Creatinine, Ser  Date Value Ref Range Status  11/19/2020 0.84 0.44 - 1.00 mg/dL Final         Passed - Patient is not pregnant      Passed - Valid encounter within last 12 months    Recent Outpatient Visits          3 weeks ago Encounter for contraceptive management, unspecified type   [facility], FNP   3 months ago Encounter for contraceptive management, unspecified type   [facility] [place] [facility], MD   9 months ago Encounter for contraceptive management, unspecified type   [facility], MD   11 months ago Atrial bigeminy   [facility] M, M   11 months ago Annual physical exam   [facility], [place], M

## 2021-09-03 ENCOUNTER — Encounter

## 2021-09-11 ENCOUNTER — Ambulatory Visit (INDEPENDENT_AMBULATORY_CARE_PROVIDER_SITE_OTHER): Admitting: Neurology

## 2021-09-11 DIAGNOSIS — G4710 Hypersomnia, unspecified: Secondary | ICD-10-CM | POA: Diagnosis not present

## 2021-09-11 DIAGNOSIS — G4720 Circadian rhythm sleep disorder, unspecified type: Secondary | ICD-10-CM

## 2021-09-11 DIAGNOSIS — R351 Nocturia: Secondary | ICD-10-CM

## 2021-09-11 DIAGNOSIS — G4719 Other hypersomnia: Secondary | ICD-10-CM

## 2021-09-11 DIAGNOSIS — R0683 Snoring: Secondary | ICD-10-CM

## 2021-09-11 DIAGNOSIS — E663 Overweight: Secondary | ICD-10-CM

## 2021-09-15 ENCOUNTER — Telehealth: Payer: Self-pay

## 2021-09-15 NOTE — Telephone Encounter (Signed)
Copied from CRM [id number]. Topic: General - Other >> Sep 15, 2021  1:25 PM Pawlus, Maxine Glenn A wrote: Reason for CRM: Pt is needing a copy of her flu vaccine including her full name and all details, pt wanted to know if this could be sent to her.

## 2021-09-18 NOTE — Telephone Encounter (Signed)
Flu vaccine information printed and placed up front for patient to pick up. Patient advised.

## 2021-09-18 NOTE — Telephone Encounter (Signed)
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

## 2021-09-19 NOTE — Procedures (Signed)
PATIENT'S NAME:  Linda Bright, Boord DOB:      March 13, 1996      MR#:    [id number]     DATE OF RECORDING: 09/11/2021 REFERRING M.D.:  Merita Norton, FNP Study Performed:   Baseline Polysomnogram HISTORY: 26 year old woman with a history of depression, anxiety, chronic neck pain, and borderline overweight state, who reports snoring and excessive daytime somnolence as well as a family history of sleep apnea. The patient endorsed the Epworth Sleepiness Scale at 14 points. The patient's weight 134 pounds with a height of 61 (inches), resulting in a BMI of 25.4 kg/m2. The patient's neck circumference measured 13.2 inches.  CURRENT MEDICATIONS: Mobic, Robaxin, Effexor-XR   PROCEDURE:  This is a multichannel digital polysomnogram utilizing the Somnostar 11.2 system.  Electrodes and sensors were applied and monitored per AASM Specifications.   EEG, EOG, Chin and Limb EMG, were sampled at 200 Hz.  ECG, Snore and Nasal Pressure, Thermal Airflow, Respiratory Effort, CPAP Flow and Pressure, Oximetry was sampled at 50 Hz. Digital video and audio were recorded.      BASELINE STUDY  Lights Out was at 20:09 and Lights On at 04:56.  Total recording time (TRT) was 527.5 minutes, with a total sleep time (TST) of 473.5 minutes.   The patient's sleep latency was 11 minutes.  REM latency was 257.5 minutes, which is delayed. The sleep efficiency was 89.8 %.     SLEEP ARCHITECTURE: WASO (Wake after sleep onset) was 42 minutes with mild sleep fragmentation noted.  There were 15.5 minutes in Stage N1, 350 minutes Stage N2, 54 minutes Stage N3 and 54 minutes in Stage REM.  The percentage of Stage N1 was 3.3%, Stage N2 was 73.9%, which is increased, Stage N3 was 11.4%, which is reduced, and Stage R (REM sleep) was 11.4%, which is reduced. The arousals were noted as: 66 were spontaneous, 0 were associated with PLMs, 0 were associated with respiratory events.  RESPIRATORY ANALYSIS:  There were a total of 1 respiratory events:  0  obstructive apneas, 0 central apneas and 0 mixed apneas with a total of 0 apneas and an apnea index (AI) of 0 /hour. There were 1 hypopneas with a hypopnea index of .1 /hour. The patient also had 0 respiratory event related arousals (RERAs).      The total APNEA/HYPOPNEA INDEX (AHI) was .1/hour and the total RESPIRATORY DISTURBANCE INDEX was  .1 /hour.  0 events occurred in REM sleep and 2 events in NREM. The REM AHI was  0 /hour, versus a non-REM AHI of .1. The patient spent 81.5 minutes of total sleep time in the supine position and 392 minutes in non-supine.. The supine AHI was 0.0 versus a non-supine AHI of 0.2.  OXYGEN SATURATION & C02:  The Wake baseline 02 saturation was 97%, with the lowest being 92%. Time spent below 89% saturation equaled 0 minutes.  PERIODIC LIMB MOVEMENTS:  The patient had a total of 0 Periodic Limb Movements.  The Periodic Limb Movement (PLM) index was 0 and the PLM Arousal index was 0/hour.  Audio and video analysis did not show any abnormal or unusual movements, behaviors, phonations or vocalizations. The patient took no bathroom breaks. Mild intermittent snoring was noted. The EKG was in keeping with normal sinus rhythm (NSR).  Post-study, the patient indicated that sleep was worse than usual.   IMPRESSION:  Primary Snoring Dysfunctions associated with sleep stages or arousal from sleep  RECOMMENDATIONS:  This study does not demonstrate any significant obstructive or central  sleep disordered breathing with the exception of mild, intermittent snoring. This study does not support an intrinsic sleep disorder as a cause of the patient's symptoms. Other causes, including circadian rhythm disturbances, an underlying mood disorder, medication effect and/or an underlying medical problem cannot be ruled out. For disturbing snoring, an oral appliance (through a qualified dentist) can be considered.  This study shows sleep fragmentation and abnormal sleep stage percentages;  these are nonspecific findings and per se do not signify an intrinsic sleep disorder or a cause for the patient's sleep-related symptoms. Causes include (but are not limited to) the first night effect of the sleep study, circadian rhythm disturbances, medication effect or an underlying mood disorder or medical problem.  The patient should be cautioned not to drive, work at heights, or operate dangerous or heavy equipment when tired or sleepy. Review and reiteration of good sleep hygiene measures should be pursued with any patient. The patient will be advised to follow up with the referring provider, who will be notified of the test results.  I certify that I have reviewed the entire raw data recording prior to the issuance of this report in accordance with the Standards of Accreditation of the American Academy of Sleep Medicine (AASM)  Huston Foley, MD, PhD Diplomat, American Board of Neurology and Sleep Medicine (Neurology and Sleep Medicine)

## 2021-09-20 ENCOUNTER — Telehealth: Payer: Self-pay | Admitting: *Deleted

## 2021-09-20 NOTE — Telephone Encounter (Signed)
I called the patient and LVM (ok per DPR). I advised of sleep study results as noted below by Dr Frances Furbish. I asked the pt to give a [place] a call back and let us know if she would like to pursue a referral to dentistry for an oral appliance for disturbing snoring. Advised no significant OSA found on sleep study. Advised of the additional findings about the stages of sleep, etc. Pt can return to PCP at this point.

## 2021-09-20 NOTE — Telephone Encounter (Signed)
-----   Message from Huston Foley, MD sent at 09/19/2021  5:55 PM EST ----- Patient referred by PCP for OSA concern, seen by me on 07/26/21, diagnostic PSG on 09/11/21.   Please call and notify the patient that the recent sleep study did not show any significant obstructive sleep apnea. She had mild intermittent snoring. She does not need to be treated for sleep apnea. For disturbing snoring, an oral appliance (through a qualified dentist) can be considered; an oral appliance for snoring may not be covered by her medical insurance. If she would like a referral to dentistry, we can help facilitate this.  Overall, she slept fairly well and achieved all stages of sleep with some sleep disruption and increase in light stage sleep and reduced deep sleep and reduced dream sleep, which are non-specific findings.  She can FU with her PCP at this point.   Thanks,  Huston Foley, MD, PhD [facility])

## 2021-10-09 ENCOUNTER — Ambulatory Visit (INDEPENDENT_AMBULATORY_CARE_PROVIDER_SITE_OTHER): Admitting: Physician Assistant

## 2021-10-09 ENCOUNTER — Other Ambulatory Visit: Payer: Self-pay

## 2021-10-09 DIAGNOSIS — Z309 Encounter for contraceptive management, unspecified: Secondary | ICD-10-CM | POA: Diagnosis not present

## 2021-10-09 MED ORDER — MEDROXYPROGESTERONE ACETATE 150 MG/ML IM SUSP
150.0000 mg | Freq: Once | INTRAMUSCULAR | Status: AC
  Administered 2021-10-09: 150 mg via INTRAMUSCULAR

## 2021-10-09 NOTE — Progress Notes (Signed)
Patient received depo injection in right deltoid today and advised she is due for next injection between the dates of Apr 30- May 14. Appointment scheduled for Dec 25, 2021.

## 2021-12-25 ENCOUNTER — Ambulatory Visit (INDEPENDENT_AMBULATORY_CARE_PROVIDER_SITE_OTHER): Admitting: Family Medicine

## 2021-12-25 DIAGNOSIS — Z309 Encounter for contraceptive management, unspecified: Secondary | ICD-10-CM

## 2021-12-25 MED ORDER — MEDROXYPROGESTERONE ACETATE 150 MG/ML IM SUSP
150.0000 mg | Freq: Once | INTRAMUSCULAR | Status: AC
  Administered 2021-12-25: 150 mg via INTRAMUSCULAR
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
## 2022-03-13 ENCOUNTER — Ambulatory Visit (INDEPENDENT_AMBULATORY_CARE_PROVIDER_SITE_OTHER): Admitting: Family Medicine

## 2022-03-13 DIAGNOSIS — Z309 Encounter for contraceptive management, unspecified: Secondary | ICD-10-CM

## 2022-03-13 MED ORDER — MEDROXYPROGESTERONE ACETATE 150 MG/ML IM SUSP
150.0000 mg | Freq: Once | INTRAMUSCULAR | Status: AC
  Administered 2022-03-13: 150 mg via INTRAMUSCULAR

## 2022-05-29 ENCOUNTER — Ambulatory Visit: Admitting: Family Medicine

## 2022-05-31 ENCOUNTER — Ambulatory Visit: Admitting: Family Medicine

## 2022-07-25 ENCOUNTER — Ambulatory Visit
Admission: EM | Admit: 2022-07-25 | Discharge: 2022-07-25 | Disposition: A | Discharge status: Home / Self Care | Attending: Physician Assistant | Admitting: Physician Assistant

## 2022-07-25 DIAGNOSIS — J0100 Acute maxillary sinusitis, unspecified: Secondary | ICD-10-CM | POA: Diagnosis not present

## 2022-07-25 DIAGNOSIS — R42 Dizziness and giddiness: Secondary | ICD-10-CM

## 2022-07-25 DIAGNOSIS — I959 Hypotension, unspecified: Secondary | ICD-10-CM

## 2022-07-25 MED ORDER — AMOXICILLIN-POT CLAVULANATE 875-125 MG PO TABS: 1.0000 | ORAL_TABLET | Freq: Two times a day (BID) | ORAL | 0 refills | Status: DC

## 2022-07-25 NOTE — ED Provider Notes (Signed)
[facility]    CSN: [id number] Arrival date & time: 07/25/22  1042      History   Chief Complaint Chief Complaint  Patient presents with   Dizziness    HPI Linda Bright is a 26 y.o. female.   Patient here today for evaluation of lightheadedness that she states has been reoccurring over the last several days.  She reports that she was actually evaluated in the emergency room over Thanksgiving holiday for same.  While there she had normal chest x-ray, normal EKG and normal lab work.  They did note that her blood pressure was low, and she states that it does seem to be lower than typical for her.  She reports she has been taking Mucinex and has noted that lightheadedness seems to occur after this.  She is taking Mucinex for nasal congestion and pressure she has had for the last week.  She denies any chest pain or shortness of breath.  She has not had any headaches.  She denies any vision changes.  She has follow-up appointment scheduled with her primary care provider tomorrow.  The history is provided by the patient.  Dizziness Associated symptoms: no chest pain, no headaches, no nausea, no shortness of breath and no vomiting     Past Medical History:  Diagnosis Date   Anxiety    Depression     Patient Active Problem List   Diagnosis Date Noted   Flu vaccine need 07/19/2021   Neck pain on left side 07/19/2021   Encounter for contraceptive management 07/19/2021   Daytime sleepiness 07/19/2021   Snoring 07/19/2021   Chronic fatigue 07/19/2021    Past Surgical History:  Procedure Laterality Date   NO PAST SURGERIES      OB History   No obstetric history on file.      Home Medications    Prior to Admission medications   Medication Sig Start Date End Date Taking? Authorizing Provider  amoxicillin-clavulanate (AUGMENTIN) 875-125 MG tablet Take 1 tablet by mouth every 12 (twelve) hours. 07/25/22  Yes Tomi Bamberger, PA-C  meloxicam (MOBIC) 15 MG  tablet TAKE 1 TABLET(15 MG) BY MOUTH DAILY 08/15/21   Jacky Kindle, FNP  methocarbamol (ROBAXIN) 500 MG tablet Take 1 tablet (500 mg total) by mouth every 8 (eight) hours as needed for muscle spasms. 07/19/21   Jacky Kindle, FNP  venlafaxine XR (EFFEXOR-XR) 75 MG 24 hr capsule Take 75 mg by mouth daily. 07/14/21   [provider]  sertraline (ZOLOFT) 25 MG tablet Take 1 tablet (25 mg total) by mouth daily. Patient not taking: No sig reported 03/18/20 01/10/21  Trey Sailors, PA-C    Family History Family History  Problem Relation Age of Onset   Graves' disease Mother    Sleep apnea Father    CVA Father    Asthma Brother    Sleep apnea Brother    Diabetes Paternal Grandmother    Breast cancer Paternal Aunt    Colon cancer Neg Hx     Social History Social History   Tobacco Use   Smoking status: Some Days    Types: Cigars   Smokeless tobacco: Never   Tobacco comments:    Occ use of cigars on social holidays, maybe 3-4/year  Vaping Use   Vaping Use: Never used  Substance Use Topics   Alcohol use: Yes    Alcohol/week: 2.0 standard drinks of alcohol    Types: 2 Cans of beer per week  Drug use: Never     Allergies   Advil [ibuprofen]   Review of Systems Review of Systems  Constitutional:  Negative for chills and fever.  HENT:  Positive for congestion and sinus pressure.   Eyes:  Negative for discharge and redness.  Respiratory:  Negative for shortness of breath and wheezing.   Cardiovascular:  Negative for chest pain.  Gastrointestinal:  Negative for abdominal pain, nausea and vomiting.  Neurological:  Positive for light-headedness. Negative for headaches.     Physical Exam Triage Vital Signs ED Triage Vitals [07/25/22 1126]  Enc Vitals Group     BP 99/67     Pulse Rate 70     Resp 16     Temp 98.2 F (36.8 C)     Temp Source Oral     SpO2 98 %     Weight      Height      Head Circumference      Peak Flow      Pain Score 0     Pain Loc       Pain Edu?      Excl. in GC?    No data found.  Updated Vital Signs BP 99/67 (BP Location: Left Arm)   Pulse 70   Temp 98.2 F (36.8 C) (Oral)   Resp 16   SpO2 98%   Physical Exam Vitals and nursing note reviewed.  Constitutional:      General: She is not in acute distress.    Appearance: Normal appearance. She is not ill-appearing.  HENT:     Head: Normocephalic and atraumatic.  Eyes:     Conjunctiva/sclera: Conjunctivae normal.  Cardiovascular:     Rate and Rhythm: Normal rate.  Pulmonary:     Effort: Pulmonary effort is normal.  Neurological:     Mental Status: She is alert.  Psychiatric:        Mood and Affect: Mood normal.        Behavior: Behavior normal.        Thought Content: Thought content normal.      UC Treatments / Results  Labs (all labs ordered are listed, but only abnormal results are displayed) Labs Reviewed - No data to display  EKG   Radiology No results found.  Procedures Procedures (including critical care time)  Medications Ordered in UC Medications - No data to display  Initial Impression / Assessment and Plan / UC Course  I have reviewed the triage vital signs and the nursing notes.  Pertinent labs & imaging results that were available during my care of the patient were reviewed by me and considered in my medical decision making (see chart for details).    Discussed that given symptoms started with the use of Mucinex it would be reasonable to discontinue same, increase fluids, and start antibiotic therapy for sinusitis given duration of symptoms.  Recommended she keep appointment with primary care tomorrow.  Encouraged sooner follow-up with any further concerns.  Final Clinical Impressions(s) / UC Diagnoses   Final diagnoses:  Lightheadedness  Acute maxillary sinusitis, recurrence not specified  Hypotension, unspecified hypotension type   Discharge Instructions   None    ED Prescriptions     Medication Sig Dispense  Auth. Provider   amoxicillin-clavulanate (AUGMENTIN) 875-125 MG tablet Take 1 tablet by mouth every 12 (twelve) hours. 14 tablet Tomi Bamberger, PA-C      PDMP not reviewed this encounter.   Tomi Bamberger, PA-C 07/25/22 1202

## 2022-07-25 NOTE — ED Triage Notes (Signed)
Pt c/o syncope episode ~ 3 days ago was seen in ED told BP is low. States tonight they are feeling lightheaded again. Concerned for BP issue.

## 2022-08-02 ENCOUNTER — Ambulatory Visit: Admitting: Family Medicine

## 2022-10-22 ENCOUNTER — Encounter: Payer: Self-pay | Admitting: Internal Medicine

## 2022-10-22 ENCOUNTER — Ambulatory Visit: Payer: 59 | Admitting: Internal Medicine

## 2022-10-22 VITALS — BP 100/80 | HR 65 | Temp 98.2°F | Wt 134.5 lb

## 2022-10-22 DIAGNOSIS — R5383 Other fatigue: Secondary | ICD-10-CM

## 2022-10-22 DIAGNOSIS — Z0001 Encounter for general adult medical examination with abnormal findings: Secondary | ICD-10-CM | POA: Diagnosis not present

## 2022-10-22 DIAGNOSIS — R400 Somnolence: Secondary | ICD-10-CM | POA: Diagnosis not present

## 2022-10-22 DIAGNOSIS — Z23 Encounter for immunization: Secondary | ICD-10-CM

## 2022-10-22 DIAGNOSIS — R102 Pelvic and perineal pain: Secondary | ICD-10-CM

## 2022-10-22 LAB — COMPREHENSIVE METABOLIC PANEL
ALT: 20 U/L (ref 0–35)
AST: 21 U/L (ref 0–37)
Albumin: 4.3 g/dL (ref 3.5–5.2)
Alkaline Phosphatase: 70 U/L (ref 39–117)
BUN: 11 mg/dL (ref 6–23)
CO2: 27 mEq/L (ref 19–32)
Calcium: 10.1 mg/dL (ref 8.4–10.5)
Chloride: 103 mEq/L (ref 96–112)
Creatinine, Ser: 0.7 mg/dL (ref 0.40–1.20)
GFR: 117.39 mL/min (ref >60.00)
Glucose, Bld: 90 mg/dL (ref 70–99)
Potassium: 4.3 mEq/L (ref 3.5–5.1)
Sodium: 138 mEq/L (ref 135–145)
Total Bilirubin: 0.4 mg/dL (ref 0.2–1.2)
Total Protein: 7.2 g/dL (ref 6.0–8.3)

## 2022-10-22 LAB — LIPID PANEL
Cholesterol: 138 mg/dL (ref 0–200)
HDL: 57.4 mg/dL (ref >39.00)
LDL Cholesterol: 67 mg/dL (ref 0–99)
NonHDL: 80.76
Total CHOL/HDL Ratio: 2
Triglycerides: 70 mg/dL (ref 0.0–149.0)
VLDL: 14 mg/dL (ref 0.0–40.0)

## 2022-10-22 LAB — CBC
HCT: 40.4 % (ref 36.0–46.0)
Hemoglobin: 13.7 g/dL (ref 12.0–15.0)
MCHC: 33.9 g/dL (ref 30.0–36.0)
MCV: 91.7 fl (ref 78.0–100.0)
Platelets: 328 10*3/uL (ref 150.0–400.0)
RBC: 4.41 Mil/uL (ref 3.87–5.11)
RDW: 12.8 % (ref 11.5–15.5)
WBC: 7 10*3/uL (ref 4.0–10.5)

## 2022-10-22 LAB — VITAMIN D 25 HYDROXY (VIT D DEFICIENCY, FRACTURES): VITD: 34.45 ng/mL (ref 30.00–100.00)

## 2022-10-22 LAB — FERRITIN: Ferritin: 101.2 ng/mL (ref 10.0–291.0)

## 2022-10-22 LAB — T4, FREE: Free T4: 0.83 ng/dL (ref 0.60–1.60)

## 2022-10-22 LAB — TSH: TSH: 1.45 u[IU]/mL (ref 0.35–5.50)

## 2022-10-22 LAB — VITAMIN B12: Vitamin B-12: 540 pg/mL (ref 211–911)

## 2022-10-22 NOTE — Patient Instructions (Signed)
We have given you the flu shot.  We will get the ultrasound and the labs today.

## 2022-10-22 NOTE — Assessment & Plan Note (Signed)
Checking CBC, CMP, TSH, vitamin D, B12, ferritin. Treat as appropriate.

## 2022-10-22 NOTE — Assessment & Plan Note (Signed)
Checking US pelvis to assess for ovarian cysts since now off depo-provera. We discussed if this is causing pain options for birth control if desired for ovarian stabilization.

## 2022-10-22 NOTE — Assessment & Plan Note (Signed)
Prior sleep study ruling out OSA. Unclear if may be hormonal changes this is worse in last few years. Checking thyroid, B12, vitamin D, CBC, CMP, ferritin.

## 2022-10-22 NOTE — Progress Notes (Unsigned)
   Subjective:   Patient ID: Linda Bright, female    DOB: 1996/04/25, 27 y.o.   MRN: [id number]  HPI The patient is a new 27 YO coming in for pelvis/back pain. Stopped depo-progesterone last injection July 2023. Had been on since early puberty due to having some ovary cysts that were very painful at the time. 1st period since earlier this month. Pain started within 1-2 months.   Also desires physical  PMH, [facility], social history reviewed and updated  Review of Systems  Objective:  Physical Exam  Vitals:   10/22/22 0950  BP: 100/80  Pulse: 65  Temp: 98.2 F (36.8 C)  TempSrc: Oral  SpO2: 99%  Weight: 134 lb 8 oz (61 kg)    Assessment & Plan:

## 2022-10-23 ENCOUNTER — Encounter: Payer: Self-pay | Admitting: Internal Medicine

## 2022-10-23 NOTE — Assessment & Plan Note (Signed)
Flu shot given. Covid-19 counseled. Tetanus due, pap smear up to date. Counseled about sun safety and mole surveillance. Counseled about the dangers of distracted driving. Given 10 year screening recommendations.

## 2022-11-19 ENCOUNTER — Ambulatory Visit
Admission: RE | Admit: 2022-11-19 | Discharge: 2022-11-19 | Disposition: A | Discharge status: Home / Self Care | Payer: 59 | Source: Ambulatory Visit | Attending: Internal Medicine | Admitting: Internal Medicine

## 2022-11-19 DIAGNOSIS — R102 Pelvic and perineal pain: Secondary | ICD-10-CM

## 2022-11-26 ENCOUNTER — Ambulatory Visit (INDEPENDENT_AMBULATORY_CARE_PROVIDER_SITE_OTHER): Payer: 59

## 2022-11-26 DIAGNOSIS — Z309 Encounter for contraceptive management, unspecified: Secondary | ICD-10-CM

## 2022-11-26 LAB — POCT URINE PREGNANCY: Preg Test, Ur: NEGATIVE

## 2022-11-26 MED ORDER — MEDROXYPROGESTERONE ACETATE 150 MG/ML IM SUSY: PREFILLED_SYRINGE | INTRAMUSCULAR | Status: DC

## 2022-11-26 NOTE — Progress Notes (Signed)
Preg test negative, medroxyprogesterone injection given. Pt tolerated injection well. Next injection scheduled for 02/25/23

## 2023-01-02 ENCOUNTER — Encounter: Payer: Self-pay | Admitting: Internal Medicine

## 2023-01-02 DIAGNOSIS — M545 Low back pain, unspecified: Secondary | ICD-10-CM

## 2023-01-07 ENCOUNTER — Ambulatory Visit (INDEPENDENT_AMBULATORY_CARE_PROVIDER_SITE_OTHER): Payer: 59

## 2023-01-07 DIAGNOSIS — M545 Low back pain, unspecified: Secondary | ICD-10-CM

## 2023-01-07 DIAGNOSIS — G8929 Other chronic pain: Secondary | ICD-10-CM | POA: Diagnosis not present

## 2023-02-25 ENCOUNTER — Ambulatory Visit (INDEPENDENT_AMBULATORY_CARE_PROVIDER_SITE_OTHER): Payer: 59

## 2023-02-25 DIAGNOSIS — Z309 Encounter for contraceptive management, unspecified: Secondary | ICD-10-CM

## 2023-02-25 MED ORDER — MEDROXYPROGESTERONE ACETATE 150 MG/ML IM SUSY
150.0000 mg | PREFILLED_SYRINGE | Freq: Once | INTRAMUSCULAR | Status: AC
  Administered 2023-02-25: 150 mg via INTRAMUSCULAR

## 2023-02-25 NOTE — Progress Notes (Signed)
Medroxyprogesterone injection given. Pt tolerated injection well. Next injection for 05/29/2023.

## 2023-03-17 ENCOUNTER — Encounter: Payer: Self-pay | Admitting: Internal Medicine

## 2023-03-18 MED ORDER — CYCLOBENZAPRINE HCL 5 MG PO TABS: 5.0000 mg | ORAL_TABLET | Freq: Three times a day (TID) | ORAL | 1 refills | Status: DC | PRN

## 2023-03-18 MED ORDER — PREDNISONE 20 MG PO TABS: 40.0000 mg | ORAL_TABLET | Freq: Every day | ORAL | 0 refills | Status: DC

## 2023-04-01 ENCOUNTER — Ambulatory Visit: Payer: 59 | Admitting: Family Medicine

## 2023-04-01 ENCOUNTER — Encounter: Payer: Self-pay | Admitting: Family Medicine

## 2023-04-01 VITALS — BP 100/62 | HR 70 | Temp 98.0°F | Resp 20 | Ht 61.0 in | Wt 150.0 lb

## 2023-04-01 DIAGNOSIS — G8929 Other chronic pain: Secondary | ICD-10-CM | POA: Diagnosis not present

## 2023-04-01 DIAGNOSIS — M5442 Lumbago with sciatica, left side: Secondary | ICD-10-CM

## 2023-04-01 MED ORDER — MELOXICAM 7.5 MG PO TABS: 7.5000 mg | ORAL_TABLET | Freq: Every day | ORAL | 1 refills | Status: DC

## 2023-04-01 MED ORDER — METHOCARBAMOL 500 MG PO TABS: 500.0000 mg | ORAL_TABLET | Freq: Three times a day (TID) | ORAL | 0 refills | Status: DC | PRN

## 2023-04-01 NOTE — Progress Notes (Signed)
Assessment & Plan:  1. Chronic midline low back pain with left-sided sciatica Continue physical therapy and chiropractor.  Start meloxicam daily and Robaxin as needed.  Discussed she cannot take both Robaxin and Flexeril.  She will take Robaxin during the day when she has to work. - meloxicam (MOBIC) 7.5 MG tablet; Take 1 tablet (7.5 mg total) by mouth daily.  Dispense: 30 tablet; Refill: 1 - methocarbamol (ROBAXIN) 500 MG tablet; Take 1 tablet (500 mg total) by mouth every 8 (eight) hours as needed.  Dispense: 30 tablet; Refill: 0 - MR LUMBAR SPINE WO CONTRAST; Future   Follow up plan: Return if symptoms worsen or fail to improve.  Deliah Boston, MSN, APRN, FNP-C  Subjective:  HPI: Linda Bright is a 27 y.o. female presenting on 04/01/2023 for Back Pain (Lower back pain - started in DEC - no known injury - pain goes into left hip /Active in [employer] - does go to PT x3 weeks /Had xrays in past )  Patient complains of low back pain that started 8 months ago.  Denies any injury.  Reports pain radiates into her left hip and is worse in the morning upon waking.  She has been doing physical therapy and seeing a chiropractor 3 times a week for the past 6 months.  She did trial prednisone 40 mg x 5 days which did not help very much.  She also has a prescription for Flexeril which she takes in the evening to allow her to get some sleep.  Tylenol and Ibuprofen relieve pain some. 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   ROS: Negative unless specifically indicated above in HPI.   Relevant past medical history reviewed and updated as indicated.   Allergies and medications reviewed and updated.   Current Outpatient Medications:    cyclobenzaprine (FLEXERIL) 5 MG tablet, Take 1 tablet (5 mg total) by mouth 3 (three) times daily as needed for muscle spasms., Disp: 30 tablet, Rfl: 1   venlafaxine XR (EFFEXOR-XR) 75 MG 24 hr capsule, Take 75 mg by mouth daily., Disp: , Rfl:    LORazepam  (ATIVAN) 1 MG tablet, For flights PRN only (Patient not taking: Reported on 04/01/2023), Disp: , Rfl:   Current Facility-Administered Medications:    medroxyPROGESTERone Acetate SUSY, , Intramuscular, Q90 days, Myrlene Broker, MD, Given at 11/26/22 1033  Allergies  Allergen Reactions   Mucinex Clear & Cool Day-Night Shortness Of Breath    Patient passed out from taking this    Advil [Ibuprofen] Hives    Specifically Advil Liquid gels     Objective:   BP 100/62   Pulse 70   Temp 98 F (36.7 C)   Resp 20   Ht 5\' 1"  (1.549 m)   Wt 150 lb (68 kg)   BMI 28.34 kg/m    Physical Exam Vitals reviewed.  Constitutional:      General: She is not in acute distress.    Appearance: Normal appearance. She is not ill-appearing, toxic-appearing or diaphoretic.  HENT:     Head: Normocephalic and atraumatic.  Eyes:     General: No scleral icterus.       Right eye: No discharge.        Left eye: No discharge.     Conjunctiva/sclera: Conjunctivae normal.  Cardiovascular:     Rate and Rhythm: Normal rate.  Pulmonary:     Effort: Pulmonary effort is normal. No respiratory distress.  Musculoskeletal:     Cervical back: Normal range  of motion.     Lumbar back: Tenderness and bony tenderness present. Positive left straight leg raise test. Negative right straight leg raise test.     Right hip: Normal.     Left hip: Normal.  Skin:    General: Skin is warm and dry.     Capillary Refill: Capillary refill takes less than 2 seconds.  Neurological:     General: No focal deficit present.     Mental Status: She is alert and oriented to person, place, and time. Mental status is at baseline.  Psychiatric:        Mood and Affect: Mood normal.        Behavior: Behavior normal.        Thought Content: Thought content normal.        Judgment: Judgment normal.

## 2023-04-07 ENCOUNTER — Ambulatory Visit
Admission: RE | Admit: 2023-04-07 | Discharge: 2023-04-07 | Disposition: A | Discharge status: Home / Self Care | Payer: 59 | Source: Ambulatory Visit | Attending: Family Medicine | Admitting: Family Medicine

## 2023-04-07 DIAGNOSIS — G8929 Other chronic pain: Secondary | ICD-10-CM

## 2023-04-22 ENCOUNTER — Encounter: Payer: Self-pay | Admitting: Family Medicine

## 2023-05-29 ENCOUNTER — Ambulatory Visit: Payer: 59

## 2023-06-03 ENCOUNTER — Ambulatory Visit (INDEPENDENT_AMBULATORY_CARE_PROVIDER_SITE_OTHER): Admitting: Internal Medicine

## 2023-06-03 ENCOUNTER — Encounter: Payer: Self-pay | Admitting: Family Medicine

## 2023-06-03 ENCOUNTER — Encounter: Payer: Self-pay | Admitting: Internal Medicine

## 2023-06-03 VITALS — BP 100/66 | HR 77 | Temp 98.1°F | Resp 16 | Ht 61.0 in | Wt 150.0 lb

## 2023-06-03 DIAGNOSIS — H1032 Unspecified acute conjunctivitis, left eye: Secondary | ICD-10-CM | POA: Diagnosis not present

## 2023-06-03 DIAGNOSIS — Z309 Encounter for contraceptive management, unspecified: Secondary | ICD-10-CM | POA: Diagnosis not present

## 2023-06-03 MED ORDER — CIPROFLOXACIN HCL 0.3 % OP SOLN: 1.0000 [drp] | OPHTHALMIC | 0 refills | Status: DC

## 2023-06-03 MED ORDER — NEOMYCIN-POLYMYXIN-DEXAMETH 3.5-10000-0.1 OP SUSP: 2.0000 [drp] | Freq: Four times a day (QID) | OPHTHALMIC | 0 refills | Status: DC

## 2023-06-03 MED ORDER — MEDROXYPROGESTERONE ACETATE 150 MG/ML IM SUSP
150.0000 mg | INTRAMUSCULAR | Status: AC
  Administered 2023-06-03: 150 mg via INTRAMUSCULAR

## 2023-06-03 NOTE — Progress Notes (Signed)
Subjective:  Patient ID: Linda Bright, female    DOB: 03-Feb-1996  Age: 27 y.o. MRN: [id number]  CC: Eye Problem   HPI Linda Bright presents for f/up ---  Discussed the use of AI scribe software for clinical note transcription with the patient, who gave verbal consent to proceed.  History of Present Illness   The patient, a contact lens wearer, presented with a 3-day history of left eye redness and irritation, which she initially thought was a sty. She reported photophobia and a mucousy clear discharge from the eye, which was more pronounced on the second day of symptoms. She reports foreign body sensation but denies trauma or injury to the eye. She had been managing the symptoms with warm compresses, artificial tears, and Benadryl, suspecting an allergic reaction. She had also stopped wearing her contact lenses four days prior when the symptoms began.  In addition, the patient mentioned that her right eye was starting to feel 'funky,' but it was not red at the time of the consultation. She also reported a history of ovarian cysts, which were managed by halting the menstrual cycle using Depo-Provera. Her last menstrual cycle was in the summer, around July or August.       Outpatient Medications Prior to Visit  Medication Sig Dispense Refill   cyclobenzaprine (FLEXERIL) 5 MG tablet Take 1 tablet (5 mg total) by mouth 3 (three) times daily as needed for muscle spasms. 30 tablet 1   LORazepam (ATIVAN) 1 MG tablet For flights PRN only     meloxicam (MOBIC) 7.5 MG tablet Take 1 tablet (7.5 mg total) by mouth daily. 30 tablet 1   methocarbamol (ROBAXIN) 500 MG tablet Take 1 tablet (500 mg total) by mouth every 8 (eight) hours as needed. 30 tablet 0   venlafaxine XR (EFFEXOR-XR) 75 MG 24 hr capsule Take 75 mg by mouth daily.     Facility-Administered Medications Prior to Visit  Medication Dose Route Frequency Provider Last Rate Last Admin   medroxyPROGESTERone Acetate SUSY    Intramuscular Q90 days Myrlene Broker, MD   Given at 11/26/22 1033    ROS Review of Systems  Constitutional:  Negative for diaphoresis and fatigue.  HENT: Negative.  Negative for sore throat.   Eyes:  Positive for photophobia, pain, discharge, redness and itching. Negative for visual disturbance.  Respiratory:  Negative for cough and shortness of breath.   Cardiovascular:  Negative for chest pain, palpitations and leg swelling.  Gastrointestinal:  Negative for abdominal pain, constipation, diarrhea, nausea and vomiting.  Genitourinary: Negative.   Musculoskeletal: Negative.   Skin: Negative.   Neurological: Negative.  Negative for headaches.  Hematological:  Negative for adenopathy. Does not bruise/bleed easily.  Psychiatric/Behavioral: Negative.      Objective:  BP 100/66 (BP Location: Left Arm, Patient Position: Sitting, Cuff Size: Large)   Pulse 77   Temp 98.1 F (36.7 C) (Oral)   Resp 16   Ht 5\' 1"  (1.549 m)   Wt 150 lb (68 kg)   LMP 03/14/2023 (Exact Date)   SpO2 99%   BMI 28.34 kg/m   BP Readings from Last 3 Encounters:  06/03/23 100/66  04/01/23 100/62  10/22/22 100/80    Wt Readings from Last 3 Encounters:  06/03/23 150 lb (68 kg)  04/01/23 150 lb (68 kg)  10/22/22 134 lb 8 oz (61 kg)    Physical Exam Vitals reviewed.  Constitutional:      Appearance: Normal appearance.  HENT:     Nose:  Nose normal. No rhinorrhea.     Mouth/Throat:     Mouth: Mucous membranes are moist.  Eyes:     General:        Right eye: No foreign body, discharge or hordeolum.        Left eye: No foreign body, discharge or hordeolum.     Conjunctiva/sclera:     Right eye: Right conjunctiva is not injected. No chemosis, exudate or hemorrhage.    Left eye: Left conjunctiva is injected. No chemosis, exudate or hemorrhage.    Comments: I used tropicamide ophth soln 0.5% and BioGlo to stain the left eye. There was a large round area of uptake over the lower/outer bulbar  conjunctival surface. There was no uptake over the corneal surface.  Cardiovascular:     Rate and Rhythm: Normal rate and regular rhythm.     Heart sounds: No murmur heard. Pulmonary:     Effort: Pulmonary effort is normal.     Breath sounds: No stridor. No wheezing, rhonchi or rales.  Abdominal:     General: Abdomen is flat.     Palpations: There is no mass.     Tenderness: There is no abdominal tenderness. There is no guarding.     Hernia: No hernia is present.  Musculoskeletal:        General: Normal range of motion.     Cervical back: Neck supple.     Right lower leg: No edema.     Left lower leg: No edema.  Lymphadenopathy:     Cervical: No cervical adenopathy.  Skin:    General: Skin is warm and dry.  Neurological:     General: No focal deficit present.     Mental Status: She is alert. Mental status is at baseline.  Psychiatric:        Mood and Affect: Mood normal.        Behavior: Behavior normal.     Lab Results  Component Value Date   WBC 7.0 10/22/2022   HGB 13.7 10/22/2022   HCT 40.4 10/22/2022   PLT 328.0 10/22/2022   GLUCOSE 90 10/22/2022   CHOL 138 10/22/2022   TRIG 70.0 10/22/2022   HDL 57.40 10/22/2022   LDLCALC 67 10/22/2022   ALT 20 10/22/2022   AST 21 10/22/2022   NA 138 10/22/2022   K 4.3 10/22/2022   CL 103 10/22/2022   CREATININE 0.70 10/22/2022   BUN 11 10/22/2022   CO2 27 10/22/2022   TSH 1.45 10/22/2022   HGBA1C 5.0 03/11/2020    MR LUMBAR SPINE WO CONTRAST  Result Date: 04/20/2023 CLINICAL DATA:  27 year old female with severe low back pain radiating to the left hip with flexion since February this year. Persistent symptoms despite treatment. EXAM: MRI LUMBAR SPINE WITHOUT CONTRAST TECHNIQUE: Multiplanar, multisequence MR imaging of the lumbar spine was performed. No intravenous contrast was administered. COMPARISON:  Lumbar radiographs 01/07/2023. FINDINGS: Segmentation:  Normal on the comparison. Alignment: Stable, normal lumbar  lordosis. No spondylolisthesis or significant scoliosis. Vertebrae: Visualized bone marrow signal is within normal limits. No marrow edema or evidence of acute osseous abnormality. Intact visible sacrum and SI joints. Conus medullaris and cauda equina: Conus extends to the T12-L1 level. No lower spinal cord or conus signal abnormality. Capacious spinal canal. Normal cauda equina nerve roots. Paraspinal and other soft tissues: Negative. Disc levels: T11-T12: Partially visible, grossly negative. T12-L1:  Negative. L1-L2:  Negative. L2-L3:  Negative. L3-L4:  Negative. L4-L5:  Negative. L5-S1:  Negative. IMPRESSION: Normal MRI  appearance of the Lumbar Spine. Electronically Signed   By: Odessa Fleming M.D.   On: 04/20/2023 11:19    Assessment & Plan:   Acute bacterial conjunctivitis of left eye- She was encouraged not to wear her contacts for the next 1 to 2 weeks.  Will treat for staph and other bacterial causes with a topical fluoroquinolone. -     Ciprofloxacin HCl; Place 1 drop into the left eye every 4 (four) hours while awake. Administer 1 drop, every 2 hours, while awake, for 2 days. Then 1 drop, every 4 hours, while awake, for the next 5 days.  Dispense: 5 mL; Refill: 0  Encounter for contraceptive management, unspecified type -     medroxyPROGESTERone Acetate     Follow-up: Return if symptoms worsen or fail to improve.  Sanda Linger, MD

## 2023-06-03 NOTE — Patient Instructions (Signed)
Bacterial Conjunctivitis, Adult Bacterial conjunctivitis is an infection of the clear membrane that covers the white part of the eye and the inner surface of the eyelid (conjunctiva). When the blood vessels in the conjunctiva become inflamed, the eye becomes red or pink. The eye often feels irritated or itchy. Bacterial conjunctivitis spreads easily from person to person (is contagious). It also spreads easily from one eye to the other eye. What are the causes? This condition is caused by bacteria. You may get the infection if you come into close contact with: A person who is infected with the bacteria. Items that are contaminated with the bacteria, such as a face towel, contact lens solution, or eye makeup. What increases the risk? You are more likely to develop this condition if: You are exposed to other people who have the infection. You wear contact lenses. You have a sinus infection. You have had a recent eye injury or surgery. You have a weak body defense system (immune system). You have a medical condition that causes dry eyes. What are the signs or symptoms? Symptoms of this condition include: Thick, yellowish discharge from the eye. This may turn into a crust on the eyelid overnight and cause your eyelids to stick together. Tearing or watery eyes. Itchy eyes. Burning feeling in your eyes. Eye redness. Swollen eyelids. Blurred vision. How is this diagnosed? This condition is diagnosed based on your symptoms and medical history. Your health care provider may also take a sample of discharge from your eye to find the cause of your infection. How is this treated? This condition may be treated with: Antibiotic eye drops or ointment to clear the infection more quickly and prevent the spread of infection to others. Antibiotic medicines taken by mouth (orally) to treat infections that do not respond to drops or ointments or that last longer than 10 days. Cool, wet cloths (cool  compresses) placed on the eyes. Artificial tears applied 2-6 times a day. Follow these instructions at home: Medicines Take or apply your antibiotic medicine as told by your health care provider. Do not stop using the antibiotic, even if your condition improves, unless directed by your health care provider. Take or apply over-the-counter and prescription medicines only as told by your health care provider. Be very careful to avoid touching the edge of your eyelid with the eye-drop bottle or the ointment tube when you apply medicines to the affected eye. This will keep you from spreading the infection to your other eye or to other people. Managing discomfort Gently wipe away any drainage from your eye with a warm, wet washcloth or a cotton ball. Apply a clean, cool compress to your eye for 10-20 minutes, 3-4 times a day. General instructions Do not wear contact lenses until the inflammation is gone and your health care provider says it is safe to wear them again. Ask your health care provider how to sterilize or replace your contact lenses before you use them again. Wear glasses until you can resume wearing contact lenses. Avoid wearing eye makeup until the inflammation is gone. Throw away any old eye cosmetics that may be contaminated. Change or wash your pillowcase every day. Do not share towels or washcloths. This may spread the infection. Wash your hands often with soap and water for at least 20 seconds and especially before touching your face or eyes. Use paper towels to dry your hands. Avoid touching or rubbing your eyes. Do not drive or use heavy machinery if your vision is blurred. Contact  a health care provider if: You have a fever. Your symptoms do not get better after 10 days. Get help right away if: You have a fever and your symptoms suddenly get worse. You have severe pain when you move your eye. You have facial pain, redness, or swelling. You have a sudden loss of  vision. Summary Bacterial conjunctivitis is an infection of the clear membrane that covers the white part of the eye and the inner surface of the eyelid (conjunctiva). Bacterial conjunctivitis spreads easily from eye to eye and from person to person (is contagious). Wash your hands often with soap and water for at least 20 seconds and especially before touching your face or eyes. Use paper towels to dry your hands. Take or apply your antibiotic medicine as told by your health care provider. Do not stop using the antibiotic even if your condition improves. Contact a health care provider if you have a fever or if your symptoms do not get better after 10 days. Get help right away if you have a sudden loss of vision. This information is not intended to replace advice given to you by your health care provider. Make sure you discuss any questions you have with your health care provider. Document Revised: 11/23/2020 Document Reviewed: 11/23/2020 Elsevier Patient Education  2024 [employer].

## 2023-06-05 ENCOUNTER — Ambulatory Visit: Payer: 59

## 2023-06-05 ENCOUNTER — Encounter: Payer: Self-pay | Admitting: Internal Medicine

## 2023-11-22 ENCOUNTER — Ambulatory Visit (INDEPENDENT_AMBULATORY_CARE_PROVIDER_SITE_OTHER)

## 2023-11-22 ENCOUNTER — Encounter: Payer: Self-pay | Admitting: Family Medicine

## 2023-11-22 DIAGNOSIS — Z309 Encounter for contraceptive management, unspecified: Secondary | ICD-10-CM

## 2023-11-22 MED ORDER — MEDROXYPROGESTERONE ACETATE 150 MG/ML IM SUSP
150.0000 mg | Freq: Once | INTRAMUSCULAR | Status: AC
  Administered 2023-11-22: 150 mg via INTRAMUSCULAR

## 2023-11-22 NOTE — Progress Notes (Signed)
 Pt was given Depo shot with no complications.

## 2024-01-13 ENCOUNTER — Encounter: Payer: Self-pay | Admitting: Family Medicine

## 2024-03-12 ENCOUNTER — Ambulatory Visit (INDEPENDENT_AMBULATORY_CARE_PROVIDER_SITE_OTHER)

## 2024-03-12 DIAGNOSIS — Z308 Encounter for other contraceptive management: Secondary | ICD-10-CM

## 2024-03-12 MED ORDER — MEDROXYPROGESTERONE ACETATE 150 MG/ML IM SUSY
150.0000 mg | PREFILLED_SYRINGE | INTRAMUSCULAR | Status: DC
Start: 2024-03-12 — End: 2024-03-12

## 2024-03-12 MED ORDER — MEDROXYPROGESTERONE ACETATE 150 MG/ML IM SUSP
150.0000 mg | Freq: Once | INTRAMUSCULAR | Status: AC
  Administered 2024-03-12: 150 mg via INTRAMUSCULAR

## 2024-03-12 NOTE — Progress Notes (Signed)
 Patient here for Depo injection per Dr. Rollene. Depo given in left IM and patient tolerated injection well today.

## 2024-05-19 ENCOUNTER — Telehealth: Admitting: Physician Assistant

## 2024-05-19 DIAGNOSIS — B9789 Other viral agents as the cause of diseases classified elsewhere: Secondary | ICD-10-CM | POA: Diagnosis not present

## 2024-05-19 DIAGNOSIS — J019 Acute sinusitis, unspecified: Secondary | ICD-10-CM | POA: Diagnosis not present

## 2024-05-19 MED ORDER — PREDNISONE 20 MG PO TABS: 40.0000 mg | ORAL_TABLET | Freq: Every day | ORAL | 0 refills | Status: AC

## 2024-05-19 MED ORDER — FLUTICASONE PROPIONATE 50 MCG/ACT NA SUSP: 2.0000 | Freq: Every day | NASAL | 0 refills | Status: AC

## 2024-05-19 NOTE — Progress Notes (Signed)
 E-Visit for Sinus Problems  We are sorry that you are not feeling well.  Here is how we plan to help!  Based on what you have shared with me it looks like you have sinusitis.  Sinusitis is inflammation and infection in the sinus cavities of the head.  Based on your presentation I believe you most likely have Acute Viral Sinusitis.This is an infection most likely caused by a virus. There is not specific treatment for viral sinusitis other than to help you with the symptoms until the infection runs its course.  You may use an oral decongestant such as Mucinex D or if you have glaucoma or high blood pressure use plain Mucinex. Saline nasal spray help and can safely be used as often as needed for congestion, I have prescribed: Fluticasone  nasal spray two sprays in each nostril once a day and Prednisone  20mg  Take 2 tablets (40mg ) daily for 5 days.  Some authorities believe that zinc sprays or the use of Echinacea may shorten the course of your symptoms.  Sinus infections are not as easily transmitted as other respiratory infection, however we still recommend that you avoid close contact with loved ones, especially the very young and elderly.  Remember to wash your hands thoroughly throughout the day as this is the number one way to prevent the spread of infection!  I would also recommend to take an at home Covid test to rule this out as there have been very high numbers in the community at this time.   Home Care: Only take medications as instructed by your medical team. Do not take these medications with alcohol. A steam or ultrasonic humidifier can help congestion.  You can place a towel over your head and breathe in the steam from hot water coming from a faucet. Avoid close contacts especially the very young and the elderly. Cover your mouth when you cough or sneeze. Always remember to wash your hands.  Get Help Right Away If: You develop worsening fever or sinus pain. You develop a severe head  ache or visual changes. Your symptoms persist after you have completed your treatment plan.  Make sure you Understand these instructions. Will watch your condition. Will get help right away if you are not doing well or get worse.   Thank you for choosing an e-visit.  Your e-visit answers were reviewed by a board certified advanced clinical practitioner to complete your personal care plan. Depending upon the condition, your plan could have included both over the counter or prescription medications.  Please review your pharmacy choice. Make sure the pharmacy is open so you can pick up prescription now. If there is a problem, you may contact your provider through [employer] and have the prescription routed to another pharmacy.  Your safety is important to us . If you have drug allergies check your prescription carefully.   For the next 24 hours you can use MyChart to ask questions about today's visit, request a non-urgent call back, or ask for a work or school excuse. You will get an email in the next two days asking about your experience. I hope that your e-visit has been valuable and will speed your recovery.    I have spent 5 minutes in review of e-visit questionnaire, review and updating patient chart, medical decision making and response to patient.   Delon CHRISTELLA Dickinson, PA-C
